# Patient Record
Sex: Female | Born: 1990 | Race: Black or African American | Hispanic: No | Marital: Single | Smoking: Never smoker
Health system: Southern US, Community
[De-identification: ages and names within clinical notes are randomized; demographics above are authoritative.]

## PROBLEM LIST (undated history)

## (undated) DIAGNOSIS — I10 Essential (primary) hypertension: Secondary | ICD-10-CM

## (undated) DIAGNOSIS — Z9114 Patient's other noncompliance with medication regimen: Secondary | ICD-10-CM

## (undated) DIAGNOSIS — Z91148 Patient's other noncompliance with medication regimen for other reason: Secondary | ICD-10-CM

## (undated) DIAGNOSIS — K8050 Calculus of bile duct without cholangitis or cholecystitis without obstruction: Secondary | ICD-10-CM

## (undated) HISTORY — PX: NO PAST SURGERIES: SHX2092

## (undated) HISTORY — PX: INGUINAL HERNIA REPAIR: SUR1180

---

## 2018-02-15 ENCOUNTER — Encounter (HOSPITAL_COMMUNITY): Payer: Self-pay | Admitting: Emergency Medicine

## 2018-02-15 ENCOUNTER — Emergency Department (HOSPITAL_COMMUNITY): Payer: Self-pay

## 2018-02-15 ENCOUNTER — Other Ambulatory Visit: Payer: Self-pay

## 2018-02-15 ENCOUNTER — Emergency Department (HOSPITAL_COMMUNITY)
Admission: EM | Admit: 2018-02-15 | Discharge: 2018-02-15 | Disposition: A | Discharge status: Home / Self Care | Payer: Self-pay | Attending: Emergency Medicine | Admitting: Emergency Medicine

## 2018-02-15 DIAGNOSIS — R109 Unspecified abdominal pain: Secondary | ICD-10-CM

## 2018-02-15 DIAGNOSIS — K8050 Calculus of bile duct without cholangitis or cholecystitis without obstruction: Secondary | ICD-10-CM

## 2018-02-15 DIAGNOSIS — K8020 Calculus of gallbladder without cholecystitis without obstruction: Secondary | ICD-10-CM | POA: Insufficient documentation

## 2018-02-15 DIAGNOSIS — I10 Essential (primary) hypertension: Secondary | ICD-10-CM | POA: Insufficient documentation

## 2018-02-15 HISTORY — DX: Essential (primary) hypertension: I10

## 2018-02-15 LAB — CBC
HCT: 39.9 % (ref 36.0–46.0)
Hemoglobin: 12.8 g/dL (ref 12.0–15.0)
MCH: 27.2 pg (ref 26.0–34.0)
MCHC: 32.1 g/dL (ref 30.0–36.0)
MCV: 84.9 fL (ref 78.0–100.0)
PLATELETS: 287 10*3/uL (ref 150–400)
RBC: 4.7 MIL/uL (ref 3.87–5.11)
RDW: 15 % (ref 11.5–15.5)
WBC: 8.6 10*3/uL (ref 4.0–10.5)

## 2018-02-15 LAB — URINALYSIS, ROUTINE W REFLEX MICROSCOPIC
Bilirubin Urine: NEGATIVE
GLUCOSE, UA: NEGATIVE mg/dL
Hgb urine dipstick: NEGATIVE
KETONES UR: NEGATIVE mg/dL
LEUKOCYTES UA: NEGATIVE
Nitrite: NEGATIVE
PROTEIN: NEGATIVE mg/dL
Specific Gravity, Urine: 1.015 (ref 1.005–1.030)
pH: 8 (ref 5.0–8.0)

## 2018-02-15 LAB — COMPREHENSIVE METABOLIC PANEL
ALT: 22 U/L (ref 14–54)
AST: 23 U/L (ref 15–41)
Albumin: 3.9 g/dL (ref 3.5–5.0)
Alkaline Phosphatase: 99 U/L (ref 38–126)
Anion gap: 11 (ref 5–15)
BILIRUBIN TOTAL: 0.4 mg/dL (ref 0.3–1.2)
BUN: 11 mg/dL (ref 6–20)
CHLORIDE: 100 mmol/L — AB (ref 101–111)
CO2: 25 mmol/L (ref 22–32)
CREATININE: 0.65 mg/dL (ref 0.44–1.00)
Calcium: 8.8 mg/dL — ABNORMAL LOW (ref 8.9–10.3)
GFR calc non Af Amer: >60 mL/min (ref >60)
Glucose, Bld: 123 mg/dL — ABNORMAL HIGH (ref 65–99)
Potassium: 3.2 mmol/L — ABNORMAL LOW (ref 3.5–5.1)
Sodium: 136 mmol/L (ref 135–145)
Total Protein: 7.9 g/dL (ref 6.5–8.1)

## 2018-02-15 LAB — LIPASE, BLOOD: LIPASE: 30 U/L (ref 11–51)

## 2018-02-15 LAB — PREGNANCY, URINE: PREG TEST UR: NEGATIVE

## 2018-02-15 MED ORDER — SODIUM CHLORIDE 0.9 % IV BOLUS (SEPSIS)
1000.0000 mL | Freq: Once | INTRAVENOUS | Status: AC
  Administered 2018-02-15: 1000 mL via INTRAVENOUS

## 2018-02-15 MED ORDER — LISINOPRIL 10 MG PO TABS: 10.0000 mg | ORAL_TABLET | Freq: Every day | ORAL | 0 refills | Status: DC

## 2018-02-15 MED ORDER — HYDROMORPHONE HCL 1 MG/ML IJ SOLN
0.5000 mg | Freq: Once | INTRAMUSCULAR | Status: AC
  Administered 2018-02-15: 0.5 mg via INTRAVENOUS
  Filled 2018-02-15: qty 1

## 2018-02-15 MED ORDER — ONDANSETRON HCL 4 MG PO TABS: 4.0000 mg | ORAL_TABLET | Freq: Three times a day (TID) | ORAL | 0 refills | Status: DC | PRN

## 2018-02-15 MED ORDER — HYDROCODONE-ACETAMINOPHEN 5-325 MG PO TABS: 1.0000 | ORAL_TABLET | Freq: Four times a day (QID) | ORAL | 0 refills | Status: DC | PRN

## 2018-02-15 NOTE — ED Notes (Signed)
US at bedside

## 2018-02-15 NOTE — ED Notes (Signed)
EDP at bedside  

## 2018-02-15 NOTE — ED Triage Notes (Addendum)
Pt reports sudden onset of constant RUQ pain and lightheadedness that began around 0500. Denies n/v/d. LMP in December.

## 2018-02-15 NOTE — ED Provider Notes (Signed)
Emergency Department Provider Note   I have reviewed the triage vital signs and the nursing notes.   HISTORY  Chief Complaint Abdominal Pain   HPI Leah Palmer is a 27 y.o. female with a past medical history of hypertension not on medication the presents to the emergency department today secondary to right upper quadrant abdominal pain.  Patient states that sometime last night she had onset of the pain and it progressed throughout the night however she was able to sleep.  States this morning around 5:00 it got worse and she started having some lightheadedness with it so she presented here for further evaluation.  She is had pain like this before sometimes it is very brief and fleeting but she has had to go to the hospital in the past and was told that she had gallstones.  She went to a surgeon but they would not do surgery as her blood pressure was too high.  Her boyfriend also states that she has had some abdominal pain in the past where she had a clot in her uterus that they had to pull out but that pain was more lower than what this is according to the patient.  She had no rashes, trauma, nausea, vomiting, constipation or diarrhea.  She has unprotected intercourse but states is impossible for her to be pregnant but will not elaborate further..  No other associated or modifying symptoms.    Past Medical History:  Diagnosis Date  . Hypertension     There are no active problems to display for this patient.   History reviewed. No pertinent surgical history.    Allergies Patient has no known allergies.  No family history on file.  Social History Social History   Tobacco Use  . Smoking status: Never Smoker  . Smokeless tobacco: Never Used  Substance Use Topics  . Alcohol use: Not Currently    Frequency: Never  . Drug use: Not on file    Review of Systems  All other systems negative except as documented in the HPI. All pertinent positives and negatives as reviewed in  the HPI. ____________________________________________   PHYSICAL EXAM:  VITAL SIGNS: ED Triage Vitals  Enc Vitals Group     BP 02/15/18 0658 (!) 170/108     Pulse Rate 02/15/18 0658 94     Resp 02/15/18 0658 20     Temp 02/15/18 0658 97.9 F (36.6 C)     Temp Source 02/15/18 0658 Oral     SpO2 02/15/18 0658 99 %     Weight 02/15/18 0657 235 lb (106.6 kg)     Height 02/15/18 0657 5\' 1"  (1.549 m)    Constitutional: Alert and oriented. Well appearing and in no acute distress. Eyes: Conjunctivae are normal. PERRL. EOMI. Head: Atraumatic. Nose: No congestion/rhinnorhea. Mouth/Throat: Mucous membranes are moist.  Oropharynx non-erythematous. Neck: No stridor.  No meningeal signs.   Cardiovascular: Normal rate, regular rhythm. Good peripheral circulation. Grossly normal heart sounds.   Respiratory: Normal respiratory effort.  No retractions. Lungs CTAB. Gastrointestinal: Soft and ttp RUQ. No distention.  Musculoskeletal: No lower extremity tenderness nor edema. No gross deformities of extremities. Neurologic:  Normal speech and language. No gross focal neurologic deficits are appreciated.  Skin:  Skin is warm, dry and intact. No rash noted.  ____________________________________________   LABS (all labs ordered are listed, but only abnormal results are displayed)  Labs Reviewed  COMPREHENSIVE METABOLIC PANEL - Abnormal; Notable for the following components:      Result  Value   Potassium 3.2 (*)    Chloride 100 (*)    Glucose, Bld 123 (*)    Calcium 8.8 (*)    All other components within normal limits  URINALYSIS, ROUTINE W REFLEX MICROSCOPIC - Abnormal; Notable for the following components:   APPearance CLOUDY (*)    All other components within normal limits  LIPASE, BLOOD  CBC  PREGNANCY, URINE   ____________________________________________  RADIOLOGY  [place] Abdomen Limited Ruq  Result Date: 02/15/2018 CLINICAL DATA:  Right upper quadrant pain since 5 a.m. EXAM:  ULTRASOUND ABDOMEN LIMITED RIGHT UPPER QUADRANT COMPARISON:  None. FINDINGS: Gallbladder: Multiple shadowing and layering gallstones. There is mild gallbladder wall thickening to 4-5 mm, but no focal tenderness or pericholecystic edema. Common bile duct: Diameter: 5 mm. Incomplete coverage. Where visualized, no filling defect. Liver: No focal lesion identified. Within normal limits in parenchymal echogenicity. Portal vein is patent on color Doppler imaging with normal direction of blood flow towards the liver. IMPRESSION: Multiple gallstones. There is gallbladder wall thickening but no focal tenderness or pericholecystic edema to increase chances of acute cholecystitis. Electronically Signed   By: Marnee SpringJonathon  Watts M.D.   On: 02/15/2018 09:12    ____________________________________________  INITIAL IMPRESSION / ASSESSMENT AND PLAN / ED COURSE  Signs symptoms seem consistent with likely biliary colic.  Less suspicion for the type of retained blood in the uterus at this point.  Pain resolved, ultrasound without evidence of cholecystitis and labs are unremarkable.  I suspect she has biliary colic as a cause of her symptoms will refer to general surgery as an outpatient.  Also restarted on blood pressure medication and will need to follow-up with primary doctor to get it continued.   Pertinent labs & imaging results that were available during my care of the patient were reviewed by me and considered in my medical decision making (see chart for details).  ____________________________________________  FINAL CLINICAL IMPRESSION(S) / ED DIAGNOSES  Final diagnoses:  Abdominal pain  Calculus of gallbladder without cholecystitis without obstruction  Biliary colic     MEDICATIONS GIVEN DURING THIS VISIT:  Medications  HYDROmorphone (DILAUDID) injection 0.5 mg (0.5 mg Intravenous Given 02/15/18 0725)  sodium chloride 0.9 % bolus 1,000 mL (0 mLs Intravenous Stopped 02/15/18 0823)     NEW OUTPATIENT  MEDICATIONS STARTED DURING THIS VISIT:  New Prescriptions   HYDROCODONE-ACETAMINOPHEN (NORCO/VICODIN) 5-325 MG TABLET    Take 1 tablet by mouth every 6 (six) hours as needed for severe pain.   LISINOPRIL (PRINIVIL,ZESTRIL) 10 MG TABLET    Take 1 tablet (10 mg total) by mouth daily.    Note:  This note was prepared with assistance of Dragon voice recognition software. Occasional wrong-word or sound-a-like substitutions may have occurred due to the inherent limitations of voice recognition software.   Marily MemosMesner, Chrishonda Hesch, MD 02/15/18 1012

## 2018-06-19 ENCOUNTER — Other Ambulatory Visit: Payer: Self-pay

## 2018-06-19 ENCOUNTER — Emergency Department (HOSPITAL_COMMUNITY): Payer: Self-pay

## 2018-06-19 ENCOUNTER — Emergency Department (HOSPITAL_COMMUNITY)
Admission: EM | Admit: 2018-06-19 | Discharge: 2018-06-19 | Disposition: A | Discharge status: Home / Self Care | Payer: Self-pay | Attending: Emergency Medicine | Admitting: Emergency Medicine

## 2018-06-19 ENCOUNTER — Encounter (HOSPITAL_COMMUNITY): Payer: Self-pay | Admitting: Emergency Medicine

## 2018-06-19 DIAGNOSIS — Z9119 Patient's noncompliance with other medical treatment and regimen: Secondary | ICD-10-CM | POA: Insufficient documentation

## 2018-06-19 DIAGNOSIS — E876 Hypokalemia: Secondary | ICD-10-CM | POA: Insufficient documentation

## 2018-06-19 DIAGNOSIS — R102 Pelvic and perineal pain: Secondary | ICD-10-CM | POA: Insufficient documentation

## 2018-06-19 DIAGNOSIS — R52 Pain, unspecified: Secondary | ICD-10-CM

## 2018-06-19 DIAGNOSIS — R1011 Right upper quadrant pain: Secondary | ICD-10-CM

## 2018-06-19 DIAGNOSIS — Z9114 Patient's other noncompliance with medication regimen: Secondary | ICD-10-CM

## 2018-06-19 DIAGNOSIS — K8020 Calculus of gallbladder without cholecystitis without obstruction: Secondary | ICD-10-CM | POA: Insufficient documentation

## 2018-06-19 DIAGNOSIS — I10 Essential (primary) hypertension: Secondary | ICD-10-CM | POA: Insufficient documentation

## 2018-06-19 DIAGNOSIS — Z79899 Other long term (current) drug therapy: Secondary | ICD-10-CM | POA: Insufficient documentation

## 2018-06-19 DIAGNOSIS — K8050 Calculus of bile duct without cholangitis or cholecystitis without obstruction: Secondary | ICD-10-CM | POA: Insufficient documentation

## 2018-06-19 HISTORY — DX: Calculus of bile duct without cholangitis or cholecystitis without obstruction: K80.50

## 2018-06-19 HISTORY — DX: Patient's other noncompliance with medication regimen for other reason: Z91.148

## 2018-06-19 HISTORY — DX: Patient's other noncompliance with medication regimen: Z91.14

## 2018-06-19 LAB — URINALYSIS, ROUTINE W REFLEX MICROSCOPIC
Bilirubin Urine: NEGATIVE
GLUCOSE, UA: NEGATIVE mg/dL
KETONES UR: 5 mg/dL — AB
LEUKOCYTES UA: NEGATIVE
NITRITE: NEGATIVE
PROTEIN: NEGATIVE mg/dL
Specific Gravity, Urine: 1.012 (ref 1.005–1.030)
pH: 8 (ref 5.0–8.0)

## 2018-06-19 LAB — CBC
HEMATOCRIT: 37.2 % (ref 36.0–46.0)
HEMOGLOBIN: 12.2 g/dL (ref 12.0–15.0)
MCH: 28 pg (ref 26.0–34.0)
MCHC: 32.8 g/dL (ref 30.0–36.0)
MCV: 85.3 fL (ref 78.0–100.0)
Platelets: 296 10*3/uL (ref 150–400)
RBC: 4.36 MIL/uL (ref 3.87–5.11)
RDW: 13.8 % (ref 11.5–15.5)
WBC: 10.4 10*3/uL (ref 4.0–10.5)

## 2018-06-19 LAB — COMPREHENSIVE METABOLIC PANEL
ALK PHOS: 86 U/L (ref 38–126)
ALT: 23 U/L (ref 0–44)
ANION GAP: 11 (ref 5–15)
AST: 24 U/L (ref 15–41)
Albumin: 4.2 g/dL (ref 3.5–5.0)
BILIRUBIN TOTAL: 0.3 mg/dL (ref 0.3–1.2)
BUN: 11 mg/dL (ref 6–20)
CO2: 27 mmol/L (ref 22–32)
Calcium: 9 mg/dL (ref 8.9–10.3)
Chloride: 101 mmol/L (ref 98–111)
Creatinine, Ser: 0.74 mg/dL (ref 0.44–1.00)
GLUCOSE: 138 mg/dL — AB (ref 70–99)
POTASSIUM: 2.6 mmol/L — AB (ref 3.5–5.1)
Sodium: 139 mmol/L (ref 135–145)
TOTAL PROTEIN: 7.9 g/dL (ref 6.5–8.1)

## 2018-06-19 LAB — LIPASE, BLOOD: Lipase: 39 U/L (ref 11–51)

## 2018-06-19 LAB — I-STAT BETA HCG BLOOD, ED (MC, WL, AP ONLY): I-stat hCG, quantitative: <5 m[IU]/mL (ref <5)

## 2018-06-19 LAB — PREGNANCY, URINE: Preg Test, Ur: NEGATIVE

## 2018-06-19 LAB — MAGNESIUM: Magnesium: 1.8 mg/dL (ref 1.7–2.4)

## 2018-06-19 MED ORDER — ONDANSETRON 4 MG PO TBDP: 4.0000 mg | ORAL_TABLET | Freq: Three times a day (TID) | ORAL | 0 refills | Status: DC | PRN

## 2018-06-19 MED ORDER — HYDROMORPHONE HCL 1 MG/ML IJ SOLN
1.0000 mg | INTRAMUSCULAR | Status: DC | PRN
  Administered 2018-06-19: 1 mg via INTRAVENOUS
  Filled 2018-06-19: qty 1

## 2018-06-19 MED ORDER — HYDROMORPHONE HCL 1 MG/ML IJ SOLN
1.0000 mg | Freq: Once | INTRAMUSCULAR | Status: AC
  Administered 2018-06-19: 1 mg via INTRAVENOUS
  Filled 2018-06-19: qty 1

## 2018-06-19 MED ORDER — ONDANSETRON HCL 4 MG/2ML IJ SOLN
4.0000 mg | Freq: Once | INTRAMUSCULAR | Status: AC
  Administered 2018-06-19: 4 mg via INTRAVENOUS
  Filled 2018-06-19: qty 2

## 2018-06-19 MED ORDER — HYDROCHLOROTHIAZIDE 25 MG PO TABS
25.0000 mg | ORAL_TABLET | Freq: Once | ORAL | Status: AC
  Administered 2018-06-19: 25 mg via ORAL
  Filled 2018-06-19: qty 1

## 2018-06-19 MED ORDER — MORPHINE SULFATE (PF) 4 MG/ML IV SOLN
8.0000 mg | INTRAVENOUS | Status: DC | PRN
  Administered 2018-06-19: 8 mg via INTRAVENOUS
  Filled 2018-06-19: qty 2

## 2018-06-19 MED ORDER — LISINOPRIL 10 MG PO TABS
20.0000 mg | ORAL_TABLET | Freq: Once | ORAL | Status: AC
  Administered 2018-06-19: 20 mg via ORAL
  Filled 2018-06-19: qty 2

## 2018-06-19 MED ORDER — HYDROCODONE-ACETAMINOPHEN 5-325 MG PO TABS: ORAL_TABLET | ORAL | 0 refills | Status: DC

## 2018-06-19 MED ORDER — POTASSIUM CHLORIDE 10 MEQ/100ML IV SOLN
10.0000 meq | INTRAVENOUS | Status: AC
  Administered 2018-06-19 (×4): 10 meq via INTRAVENOUS
  Filled 2018-06-19 (×4): qty 100

## 2018-06-19 NOTE — ED Notes (Addendum)
Pt reports she has been off her BP meds for 1 week and is suppose to pick it up today. Reports she takes Lisionpril. EDP notified

## 2018-06-19 NOTE — ED Notes (Signed)
Date and time results received: 06/19/18 0644 (use smartphrase ".now" to insert current time)  Test: potassium Critical Value: 2.6  Name of Provider Notified: Dr. Rhunette CroftNanavati @ [phone]  Orders Received? Or Actions Taken?: no/na

## 2018-06-19 NOTE — Progress Notes (Signed)
[facility]  Full Note to Follow. Likely biliary colic.  US pending. If Cholecystitis will admit and do Monday. If Biliary colic, follow up in the office.  Leah GreenhouseLindsay Elvera Almario, MD [facility] [address] [place], [place] [id number] [phone] (office)

## 2018-06-19 NOTE — Discharge Instructions (Signed)
Eat a bland diet, avoiding greasy, fatty, fried foods, as well as spicy and acidic foods or beverages.  Avoid eating within 2 to 3 hours before going to bed or laying down.  Also avoid teas, colas, coffee, chocolate, pepermint and spearment. Take the prescriptions as directed. Pick up your high blood pressure medication at the pharmacy and take it as previously prescribed. You have received a dose of your medication while in the Emergency Department today.  Call your regular medical doctor on Monday to schedule a follow up appointment in the next 3 days. Call the General Surgeon on Monday to schedule a follow up appointment this week.  Return to the Emergency Department immediately if worsening.

## 2018-06-19 NOTE — ED Triage Notes (Signed)
Pt c/o abd pain with n/v/d since 0100.

## 2018-06-19 NOTE — Consult Note (Signed)
[facility] Consult  Reason for Consult: Cholelithiasis  Referring Physician: Dr. Thurnell Garbe  Chief Complaint    Abdominal Pain      Leah Palmer is a 27 y.o. female.  HPI: Leah Palmer is a 27 yo with a history of known stones who presented to the ED with worsening RUQ pain after a fatty meal; She has known about her cholelithiasis and has been symptomatic in the past. She was going to undergo surgery per report in the past but her BP was not controlled and surgery said she had to get this under control.    The patient had some nausea with this pain.  She presented to the ED and underwent an [place] that demonstrated stones and no signs of any cholecystitis. She had been feeling better and not having symptoms until she ate this fatty meal.   Past Medical History:  Diagnosis Date  . Biliary colic   . Hypertension   . Noncompliance with medication regimen     History reviewed. No pertinent surgical history.  History reviewed. No pertinent family history.  Social History   Tobacco Use  . Smoking status: Never Smoker  . Smokeless tobacco: Never Used  Substance Use Topics  . Alcohol use: Not Currently    Frequency: Never  . Drug use: Never    Medications: I have reviewed the patient's current medications. Allergies as of 06/19/2018   No Known Allergies     Medication List    TAKE these medications   HYDROcodone-acetaminophen 5-325 MG tablet Commonly known as:  NORCO/VICODIN 1 or 2 tabs PO q6 hours prn pain   ondansetron 4 MG disintegrating tablet Commonly known as:  ZOFRAN ODT Take 1 tablet (4 mg total) by mouth every 8 (eight) hours as needed for nausea or vomiting.     ASK your doctor about these medications   ibuprofen 400 MG tablet Commonly known as:  ADVIL,MOTRIN Take 400 mg by mouth every 6 (six) hours as needed for headache, moderate pain or cramping.   lisinopril-hydrochlorothiazide 20-25 MG tablet Commonly known as:  PRINZIDE,ZESTORETIC Take 1  tablet by mouth daily.   ondansetron 4 MG tablet Commonly known as:  ZOFRAN Take 1 tablet (4 mg total) by mouth every 8 (eight) hours as needed for nausea or vomiting.        ROS:  A comprehensive review of systems was negative except for: Gastrointestinal: positive for abdominal pain and nausea  Blood pressure (!) 193/138, pulse 98, temperature 97.7 F (36.5 C), temperature source Oral, resp. rate 15, height 5' 1"  (1.549 m), weight 240 lb (108.9 kg), last menstrual period 06/16/2018, SpO2 98 %. Physical Exam  Constitutional: She is oriented to person, place, and time. She appears well-developed and well-nourished.  HENT:  Head: Normocephalic.  Eyes: Pupils are equal, round, and reactive to light.  Cardiovascular: Normal rate.  Pulmonary/Chest: Effort normal.  Abdominal: Normal appearance. She exhibits no distension. There is tenderness.  Minimal RUQ tenderness   Neurological: She is alert and oriented to person, place, and time.  Skin: Skin is warm and dry.  Psychiatric: She has a normal mood and affect. Her behavior is normal.  Vitals reviewed.   Results: Results for orders placed or performed during the hospital encounter of 06/19/18 (from the past 48 hour(s))  Lipase, blood     Status: None   Collection Time: 06/19/18  5:38 AM  Result Value Ref Range   Lipase 39 11 - 51 U/L    Comment: Performed at  [address] [place], [place] [address]  Comprehensive metabolic panel     Status: Abnormal   Collection Time: 06/19/18  5:38 AM  Result Value Ref Range   Sodium 139 135 - 145 mmol/L   Potassium 2.6 (LL) 3.5 - 5.1 mmol/L    Comment: CRITICAL RESULT CALLED TO, READ BACK BY AND VERIFIED WITH: TALBOTT T. @ [id number] ON [id number] BY HENDERSON L.    Chloride 101 98 - 111 mmol/L   CO2 27 22 - 32 mmol/L   Glucose, Bld 138 (H) 70 - 99 mg/dL   BUN 11 6 - 20 mg/dL   Creatinine, Ser 0.74 0.44 - 1.00 mg/dL   Calcium 9.0 8.9 - 10.3 mg/dL   Total Protein 7.9 6.5 - 8.1  g/dL   Albumin 4.2 3.5 - 5.0 g/dL   AST 24 15 - 41 U/L   ALT 23 0 - 44 U/L   Alkaline Phosphatase 86 38 - 126 U/L   Total Bilirubin 0.3 0.3 - 1.2 mg/dL   GFR calc non Af Amer >60 >60 mL/min   GFR calc Af Amer >60 >60 mL/min    Comment: (NOTE) The eGFR has been calculated using the CKD EPI equation. This calculation has not been validated in all clinical situations. eGFR's persistently <60 mL/min signify possible Chronic Kidney Disease.    Anion gap 11 5 - 15    Comment: Performed at [facility], [address] [place], [place] [address]  CBC     Status: None   Collection Time: 06/19/18  5:38 AM  Result Value Ref Range   WBC 10.4 4.0 - 10.5 K/uL   RBC 4.36 3.87 - 5.11 MIL/uL   Hemoglobin 12.2 12.0 - 15.0 g/dL   HCT 37.2 36.0 - 46.0 %   MCV 85.3 78.0 - 100.0 fL   MCH 28.0 26.0 - 34.0 pg   MCHC 32.8 30.0 - 36.0 g/dL   RDW 13.8 11.5 - 15.5 %   Platelets 296 150 - 400 K/uL    Comment: Performed at [facility], [address] [place], [place] [address]  Magnesium     Status: None   Collection Time: 06/19/18  5:38 AM  Result Value Ref Range   Magnesium 1.8 1.7 - 2.4 mg/dL    Comment: Performed at [facility], [address] [place], [place] [address]  Urinalysis, Routine w reflex microscopic     Status: Abnormal   Collection Time: 06/19/18  7:37 AM  Result Value Ref Range   Color, Urine YELLOW YELLOW   APPearance CLOUDY (A) CLEAR   Specific Gravity, Urine 1.012 1.005 - 1.030   pH 8.0 5.0 - 8.0   Glucose, UA NEGATIVE NEGATIVE mg/dL   Hgb urine dipstick MODERATE (A) NEGATIVE   Bilirubin Urine NEGATIVE NEGATIVE   Ketones, ur 5 (A) NEGATIVE mg/dL   Protein, ur NEGATIVE NEGATIVE mg/dL   Nitrite NEGATIVE NEGATIVE   Leukocytes, UA NEGATIVE NEGATIVE   RBC / HPF 0-5 0 - 5 RBC/hpf   WBC, UA 0-5 0 - 5 WBC/hpf   Bacteria, UA RARE (A) NONE SEEN   Squamous Epithelial / LPF 0-5 0 - 5   Mucus PRESENT    Budding Yeast PRESENT     Comment: Performed at [facility], [address] [place], [place] [address]  I-Stat beta hCG blood, ED     Status: None   Collection Time: 06/19/18  7:45 AM  Result Value Ref Range   I-stat hCG, quantitative <5.0 <  5 mIU/mL   Comment 3            Comment:   GEST. AGE      CONC.  (mIU/mL)   <=1 WEEK        5 - 50     2 WEEKS       50 - 500     3 WEEKS       100 - 10,000     4 WEEKS     1,000 - 30,000        FEMALE AND NON-PREGNANT FEMALE:     LESS THAN 5 mIU/mL   Pregnancy, urine     Status: None   Collection Time: 06/19/18  7:56 AM  Result Value Ref Range   Preg Test, Ur NEGATIVE NEGATIVE    Comment:        THE SENSITIVITY OF THIS METHODOLOGY IS >20 mIU/mL. Performed at [facility], [address], [place], [place] [address]    Personally reviewed [place] RUQ= stones, no thickening or fluid, CBD 3.5 mm US Pelvis Transvanginal Non-ob (tv Only)  Result Date: 06/19/2018 CLINICAL DATA:  Pelvic pain for several days EXAM: TRANSABDOMINAL AND TRANSVAGINAL ULTRASOUND OF PELVIS DOPPLER ULTRASOUND OF OVARIES TECHNIQUE: Both transabdominal and transvaginal ultrasound examinations of the pelvis were performed. Transabdominal technique was performed for global imaging of the pelvis including uterus, ovaries, adnexal regions, and pelvic cul-de-sac. It was necessary to proceed with endovaginal exam following the transabdominal exam to visualize the ovaries. Color and duplex Doppler ultrasound was utilized to evaluate blood flow to the ovaries. COMPARISON:  None. FINDINGS: Uterus Measurements: 8.2 x 2.5 x 4.7 cm. No fibroids or other mass visualized. Endometrium Thickness: 8 mm.  No focal abnormality visualized. Right ovary Measurements: 4.1 x 2.0 x 2.7 cm. Normal appearance/no adnexal mass. Left ovary Measurements: 3.9 x 2.4 x 2.4 cm. Normal appearance/no adnexal mass. Pulsed Doppler evaluation of both ovaries demonstrates normal low-resistance arterial and venous waveforms. Other findings Minimal free pelvic fluid is noted likely related to the patient's  current menstrual status. IMPRESSION: Unremarkable pelvic ultrasound. Minimal free fluid is noted likely physiologic in nature. No ovarian abnormality is noted. Electronically Signed   By: Inez Catalina M.D.   On: 06/19/2018 11:17   US Pelvis (transabdominal Only)  Result Date: 06/19/2018 CLINICAL DATA:  Pelvic pain for several days EXAM: TRANSABDOMINAL AND TRANSVAGINAL ULTRASOUND OF PELVIS DOPPLER ULTRASOUND OF OVARIES TECHNIQUE: Both transabdominal and transvaginal ultrasound examinations of the pelvis were performed. Transabdominal technique was performed for global imaging of the pelvis including uterus, ovaries, adnexal regions, and pelvic cul-de-sac. It was necessary to proceed with endovaginal exam following the transabdominal exam to visualize the ovaries. Color and duplex Doppler ultrasound was utilized to evaluate blood flow to the ovaries. COMPARISON:  None. FINDINGS: Uterus Measurements: 8.2 x 2.5 x 4.7 cm. No fibroids or other mass visualized. Endometrium Thickness: 8 mm.  No focal abnormality visualized. Right ovary Measurements: 4.1 x 2.0 x 2.7 cm. Normal appearance/no adnexal mass. Left ovary Measurements: 3.9 x 2.4 x 2.4 cm. Normal appearance/no adnexal mass. Pulsed Doppler evaluation of both ovaries demonstrates normal low-resistance arterial and venous waveforms. Other findings Minimal free pelvic fluid is noted likely related to the patient's current menstrual status. IMPRESSION: Unremarkable pelvic ultrasound. Minimal free fluid is noted likely physiologic in nature. No ovarian abnormality is noted. Electronically Signed   By: Inez Catalina M.D.   On: 06/19/2018 11:17   US Pelvic Doppler (torsion R/o Or Mass Arterial Flow)  Result Date: 06/19/2018 CLINICAL DATA:  Pelvic pain for several days EXAM: TRANSABDOMINAL AND TRANSVAGINAL ULTRASOUND OF PELVIS DOPPLER ULTRASOUND OF OVARIES TECHNIQUE: Both transabdominal and transvaginal ultrasound examinations of the pelvis were performed.  Transabdominal technique was performed for global imaging of the pelvis including uterus, ovaries, adnexal regions, and pelvic cul-de-sac. It was necessary to proceed with endovaginal exam following the transabdominal exam to visualize the ovaries. Color and duplex Doppler ultrasound was utilized to evaluate blood flow to the ovaries. COMPARISON:  None. FINDINGS: Uterus Measurements: 8.2 x 2.5 x 4.7 cm. No fibroids or other mass visualized. Endometrium Thickness: 8 mm.  No focal abnormality visualized. Right ovary Measurements: 4.1 x 2.0 x 2.7 cm. Normal appearance/no adnexal mass. Left ovary Measurements: 3.9 x 2.4 x 2.4 cm. Normal appearance/no adnexal mass. Pulsed Doppler evaluation of both ovaries demonstrates normal low-resistance arterial and venous waveforms. Other findings Minimal free pelvic fluid is noted likely related to the patient's current menstrual status. IMPRESSION: Unremarkable pelvic ultrasound. Minimal free fluid is noted likely physiologic in nature. No ovarian abnormality is noted. Electronically Signed   By: Inez Catalina M.D.   On: 06/19/2018 11:17   US Abdomen Limited Ruq  Result Date: 06/19/2018 CLINICAL DATA:  Right upper quadrant pain for 1 day EXAM: ULTRASOUND ABDOMEN LIMITED RIGHT UPPER QUADRANT COMPARISON:  02/15/2018 FINDINGS: Gallbladder: Cholelithiasis is again identified. No wall thickening or pericholecystic fluid is noted. Common bile duct: Diameter: 3.5 mm. Liver: No focal lesion identified. Within normal limits in parenchymal echogenicity. Portal vein is patent on color Doppler imaging with normal direction of blood flow towards the liver. IMPRESSION: Cholelithiasis without complicating factors. Electronically Signed   By: Inez Catalina M.D.   On: 06/19/2018 11:44     Assessment & Plan:  Rosene Pilling is a 27 y.o. female with cholelithiasis and no signs of cholecystitis.  She has known about this for a while but was feeling better.  -Po trial  -Follow up in the  clinic   All questions were answered to the satisfaction of the patient.   Virl Cagey 06/19/2018, 9:03 PM

## 2018-06-19 NOTE — ED Notes (Signed)
Pt given fluids at this time 

## 2018-06-19 NOTE — ED Provider Notes (Signed)
Pt received at sign out with [place] pending. Pt c/o RUQ pain, N/V. Pt with known gallstones. Pt non-compliant with her HTN meds for at least 1 week. States she has rx refill at pharmacy, just has not gone to pick it up. Potassium repleted IV before sign out, and labs are otherwise reassuring. [place] is reassuring. HTN meds given in ED. Pt has tol PO well without N/V. General Surgeon Dr. Henreitta Leber has evaluated pt in the ED:  No emergent surgical issue at this time, OK to d/c and f/u in office. Pt agreeable with this plan. Dx and testing d/w pt.  Questions answered.  Verb understanding, agreeable to d/c home with outpt f/u.  BP (!) 174/123   Pulse 98   Temp 97.7 F (36.5 C) (Oral)   Resp 15   Ht 5\' 1"  (1.549 m)   Wt 108.9 kg (240 lb)   LMP 06/16/2018   SpO2 96%   BMI 45.35 kg/m    Results for orders placed or performed during the hospital encounter of 06/19/18  Lipase, blood  Result Value Ref Range   Lipase 39 11 - 51 U/L  Comprehensive metabolic panel  Result Value Ref Range   Sodium 139 135 - 145 mmol/L   Potassium 2.6 (LL) 3.5 - 5.1 mmol/L   Chloride 101 98 - 111 mmol/L   CO2 27 22 - 32 mmol/L   Glucose, Bld 138 (H) 70 - 99 mg/dL   BUN 11 6 - 20 mg/dL   Creatinine, Ser 1.61 0.44 - 1.00 mg/dL   Calcium 9.0 8.9 - 09.6 mg/dL   Total Protein 7.9 6.5 - 8.1 g/dL   Albumin 4.2 3.5 - 5.0 g/dL   AST 24 15 - 41 U/L   ALT 23 0 - 44 U/L   Alkaline Phosphatase 86 38 - 126 U/L   Total Bilirubin 0.3 0.3 - 1.2 mg/dL   GFR calc non Af Amer >60 >60 mL/min   GFR calc Af Amer >60 >60 mL/min   Anion gap 11 5 - 15  CBC  Result Value Ref Range   WBC 10.4 4.0 - 10.5 K/uL   RBC 4.36 3.87 - 5.11 MIL/uL   Hemoglobin 12.2 12.0 - 15.0 g/dL   HCT 04.5 40.9 - 81.1 %   MCV 85.3 78.0 - 100.0 fL   MCH 28.0 26.0 - 34.0 pg   MCHC 32.8 30.0 - 36.0 g/dL   RDW 91.4 78.2 - 95.6 %   Platelets 296 150 - 400 K/uL  Urinalysis, Routine w reflex microscopic  Result Value Ref Range   Color, Urine YELLOW YELLOW    APPearance CLOUDY (A) CLEAR   Specific Gravity, Urine 1.012 1.005 - 1.030   pH 8.0 5.0 - 8.0   Glucose, UA NEGATIVE NEGATIVE mg/dL   Hgb urine dipstick MODERATE (A) NEGATIVE   Bilirubin Urine NEGATIVE NEGATIVE   Ketones, ur 5 (A) NEGATIVE mg/dL   Protein, ur NEGATIVE NEGATIVE mg/dL   Nitrite NEGATIVE NEGATIVE   Leukocytes, UA NEGATIVE NEGATIVE   RBC / HPF 0-5 0 - 5 RBC/hpf   WBC, UA 0-5 0 - 5 WBC/hpf   Bacteria, UA RARE (A) NONE SEEN   Squamous Epithelial / LPF 0-5 0 - 5   Mucus PRESENT    Budding Yeast PRESENT   Magnesium  Result Value Ref Range   Magnesium 1.8 1.7 - 2.4 mg/dL  Pregnancy, urine  Result Value Ref Range   Preg Test, Ur NEGATIVE NEGATIVE  I-Stat beta hCG blood, ED  Result Value Ref Range   I-stat hCG, quantitative <5.0 <5 mIU/mL   Comment 3            [place] Pelvic Doppler (torsion R/o Or Mass Arterial Flow) Result Date: 06/19/2018 CLINICAL DATA:  Pelvic pain for several days EXAM: TRANSABDOMINAL AND TRANSVAGINAL ULTRASOUND OF PELVIS DOPPLER ULTRASOUND OF OVARIES TECHNIQUE: Both transabdominal and transvaginal ultrasound examinations of the pelvis were performed. Transabdominal technique was performed for global imaging of the pelvis including uterus, ovaries, adnexal regions, and pelvic cul-de-sac. It was necessary to proceed with endovaginal exam following the transabdominal exam to visualize the ovaries. Color and duplex Doppler ultrasound was utilized to evaluate blood flow to the ovaries. COMPARISON:  None. FINDINGS: Uterus Measurements: 8.2 x 2.5 x 4.7 cm. No fibroids or other mass visualized. Endometrium Thickness: 8 mm.  No focal abnormality visualized. Right ovary Measurements: 4.1 x 2.0 x 2.7 cm. Normal appearance/no adnexal mass. Left ovary Measurements: 3.9 x 2.4 x 2.4 cm. Normal appearance/no adnexal mass. Pulsed Doppler evaluation of both ovaries demonstrates normal low-resistance arterial and venous waveforms. Other findings Minimal free pelvic fluid is noted  likely related to the patient's current menstrual status. IMPRESSION: Unremarkable pelvic ultrasound. Minimal free fluid is noted likely physiologic in nature. No ovarian abnormality is noted. Electronically Signed   By: Alcide CleverMark  Lukens M.D.   On: 06/19/2018 11:17   [place] Abdomen Limited Ruq Result Date: 06/19/2018 CLINICAL DATA:  Right upper quadrant pain for 1 day EXAM: ULTRASOUND ABDOMEN LIMITED RIGHT UPPER QUADRANT COMPARISON:  02/15/2018 FINDINGS: Gallbladder: Cholelithiasis is again identified. No wall thickening or pericholecystic fluid is noted. Common bile duct: Diameter: 3.5 mm. Liver: No focal lesion identified. Within normal limits in parenchymal echogenicity. Portal vein is patent on color Doppler imaging with normal direction of blood flow towards the liver. IMPRESSION: Cholelithiasis without complicating factors. Electronically Signed   By: Alcide CleverMark  Lukens M.D.   On: 06/19/2018 11:44      Samuel JesterMcManus, Leah Weisse, DO 06/19/18 1328

## 2018-06-19 NOTE — ED Notes (Signed)
Pt still calling out in pain, Dr Rhunette CroftNanavati informed and pain medication ordered and given

## 2018-06-19 NOTE — ED Provider Notes (Signed)
[facility] EMERGENCY DEPARTMENT Provider Note   CSN: [id number] Arrival date & time: 06/19/18  [id number]     History   Chief Complaint Chief Complaint  Patient presents with  . Abdominal Pain    HPI Leah Palmer is a 27 y.o. female.  HPI 27 year old female comes in with chief complaint of abdominal pain.  Patient does not have any significant medical history besides hypertension.  She reports that her current pain started at 1 AM, and it is constant and severe.  Pain is located in the epigastric region and right upper quadrant, and it radiates towards the scapular region.  Patient has associated nausea with emesis x2.  She denies any other abdominal surgery.  No history of kidney stones and patient denies any burning with urination, blood in the urine or frequent urination.  Patient thinks she is on her period right now.  Past Medical History:  Diagnosis Date  . Hypertension     There are no active problems to display for this patient.   History reviewed. No pertinent surgical history.   OB History    Gravida  0   Para  0   Term  0   Preterm  0   AB  0   Living  0     SAB  0   TAB  0   Ectopic  0   Multiple  0   Live Births  0            Home Medications    Prior to Admission medications   Medication Sig Start Date End Date Taking? Authorizing Provider  HYDROcodone-acetaminophen (NORCO/VICODIN) 5-325 MG tablet Take 1 tablet by mouth every 6 (six) hours as needed for severe pain. 02/15/18   Mesner, Barbara CowerJason, MD  ibuprofen (ADVIL,MOTRIN) 400 MG tablet Take 400 mg by mouth every 6 (six) hours as needed for headache, moderate pain or cramping.    [provider]  lisinopril (PRINIVIL,ZESTRIL) 10 MG tablet Take 1 tablet (10 mg total) by mouth daily. 02/15/18   Mesner, Barbara CowerJason, MD  ondansetron (ZOFRAN) 4 MG tablet Take 1 tablet (4 mg total) by mouth every 8 (eight) hours as needed for nausea or vomiting. 02/15/18   Mesner, Barbara CowerJason, MD    Family  History History reviewed. No pertinent family history.  Social History Social History   Tobacco Use  . Smoking status: Never Smoker  . Smokeless tobacco: Never Used  Substance Use Topics  . Alcohol use: Not Currently    Frequency: Never  . Drug use: Never     Allergies   Patient has no known allergies.   Review of Systems Review of Systems  Constitutional: Positive for activity change.  Respiratory: Negative for shortness of breath.   Cardiovascular: Negative for chest pain.  Gastrointestinal: Positive for abdominal pain, nausea and vomiting.  All other systems reviewed and are negative.    Physical Exam Updated Vital Signs BP (!) 189/137   Pulse 93   Temp 97.7 F (36.5 C) (Oral)   Resp 20   Ht 5\' 1"  (1.549 m)   Wt 108.9 kg (240 lb)   LMP 06/16/2018   SpO2 100%   BMI 45.35 kg/m   Physical Exam  Constitutional: She is oriented to person, place, and time. She appears well-developed.  HENT:  Head: Normocephalic and atraumatic.  Eyes: EOM are normal.  Neck: Normal range of motion. Neck supple.  Cardiovascular: Normal rate.  Pulmonary/Chest: Effort normal.  Abdominal: Bowel sounds are normal. There is  tenderness in the right upper quadrant and epigastric area. There is positive Murphy's sign.  Neurological: She is alert and oriented to person, place, and time.  Skin: Skin is warm and dry.  Nursing note and vitals reviewed.    ED Treatments / Results  Labs (all labs ordered are listed, but only abnormal results are displayed) Labs Reviewed  CBC  LIPASE, BLOOD  COMPREHENSIVE METABOLIC PANEL  URINALYSIS, ROUTINE W REFLEX MICROSCOPIC  I-STAT BETA HCG BLOOD, ED ([facility], [facility], [facility] ONLY)    EKG None  Radiology No results found.  Procedures Procedures (including critical care time)  Medications Ordered in ED Medications  morphine 4 MG/ML injection 8 mg (8 mg Intravenous Given 06/19/18 0600)  ondansetron (ZOFRAN) injection 4 mg (4 mg Intravenous Given  06/19/18 0600)  HYDROmorphone (DILAUDID) injection 1 mg (1 mg Intravenous Given 06/19/18 0640)     Initial Impression / Assessment and Plan / ED Course  I have reviewed the triage vital signs and the nursing notes.  Pertinent labs & imaging results that were available during my care of the patient were reviewed by me and considered in my medical decision making (see chart for details).     27 year old female comes in with chief complaint of abdominal pain. She has known history of cholelithiasis.  DDx includes: Pancreatitis Hepatobiliary pathology including cholecystitis Gastritis/PUD SBO Nephrolithiasis Ovarian torsion Ectopic pregnancy  Based on history and exam, it seems like patient is having acute cholecystitis. Ultrasound right upper quadrant has been ordered.  Given that patient's symptoms are not located in the lower quadrants, lower back and there is known history of cholelithiasis -we think symptomatic Coley lithiasis or cholecystitis is much likely than ectopic pregnancy or ovarian torsion/kidney stones.  Pain control for now. Final Clinical Impressions(s) / ED Diagnoses   Final diagnoses:  RUQ abdominal pain  Symptomatic cholelithiasis    ED Discharge Orders    None       Derwood Kaplan, MD 06/19/18 [phone]

## 2018-06-22 DIAGNOSIS — Z139 Encounter for screening, unspecified: Secondary | ICD-10-CM

## 2018-06-22 LAB — GLUCOSE, POCT (MANUAL RESULT ENTRY): POC Glucose: 99 mg/dl (ref 70–99)

## 2018-06-22 NOTE — Congregational Nurse Program (Signed)
Congregational Nurse Program Note  Date of Encounter: 06/22/2018  Past Medical History: Past Medical History:  Diagnosis Date  . Biliary colic   . Hypertension   . Noncompliance with medication regimen     Encounter Details: CNP Questionnaire - 06/22/18 1330      Questionnaire   Patient Status  Not Applicable    Race  Black or African American    Location Patient Served At  [employer]  Not Applicable    Uninsured  Uninsured (NEW 1x/quarter)    Food  No food insecurities    Housing/Utilities  Yes, have permanent housing    Transportation  No transportation needs    Interpersonal Safety  Yes, feel physically and emotionally safe where you currently live    Medication  No medication insecurities    Medical Provider  No    Referrals  Primary Care Provider/Clinic    ED Visit Averted  Not Applicable    Life-Saving Intervention Made  Not Applicable      New Client to [facility] , client had recently been to [facility] ER for abdominal pain and was found to have gallstones. She was given pain medication, medication for nausea, referred to surgeon Dr. Henreitta Leber whom client has a follow up appointment with on 06/29/18. Client here today for assistance with referral to a primary care provider. She currently has no income, has no insurance and moved to [place] 5 months ago and lives with her boyfriend in [place]. She states her boyfriend works and pays the bills and that is how she is currently supported. She states she feels safe where she is living, but just has not learned there area yet.  Client states her abdominal pain is much better and comes and goes. She does report that she has nausea and decreased appetite. Client was last seen at a clinic in [place] where she was living and she states they prescribed medication for her hypertension. She reports she was "off my medicine for about a week or so, but started it back a couple days ago". RN discussed  importance of keeping her blood pressure under control and the risks of uncontrolled blood pressure, but her at risk for stroke, heart attack and death. Client states understanding. Client is currently taking Lisinopril/HCTZ and states the dose was recently increased to 20-25 mg daily. RN discussed that HCTZ has a diuretic affect and discussed the importance of maintaining good hydration with water.  Client denies chest pains or shortness of breath. She denies today any headache or vision changes. No difficulty with speech and gait is normal and steady denies any problems with balance or weakness of extremities.  Discussed symptoms of stroke with client and to call 911 immediately if experiences any of the symptoms. Client does report that at times she does and a headache that is mostly frontal in nature and she relates it to when her blood pressure is elevated. Discussed with client the affects of uncontrolled blood pressure on her circulation, heart, eyes, kidneys etc. Client reports understanding.  Client alert and oriented. Very quiet and looks down mostly. Does not appear anxious or nervous and answers questions appropriately. Lungs clear bilaterally , O2 saturation 99 % on room air, blood pressure auscultated by Hedda Slade RN at 150/98, pulse 90.Respirations 12, capillary blood glucose fasting 99. Temp 98.9 orally. Abdomin soft. Client states she has not eaten today. Discussed a bland , "gallbladder" type diet, client states she has a  copy the emergency room provided. Discussed to eat bland foods such as plain potatoes, rice, toast, no fried or greasy foods, not spicy foods and to not eat late at night and lie down. Client states understanding.   Discussed with client importance of keeping her appointments with Dr Henreitta LeberBridges and to maintain her appointments with her primary care provider in order to maintain health and to keep her hypertension controlled. Discussed with client the importance of  adopting some daily exercise into her routine and options for doing so such as the [employer] which is close to her and have scholarships.  Discussed with client options for referral into primary care and that they would manage her hypertension and overall general medical care and help her navigate into specialist as needed.  Client [facility] due to closer location from where she resides. Referral made and appointment secured for 06/24/18 at 10:45 am. Client instructed to bring a letter of support from her boyfriend stating she resides with him and location and that he supports her financially.   Will follow as needed.  Francee NodalPatricia R Hollie Wojahn RN [facility]

## 2018-06-24 ENCOUNTER — Ambulatory Visit: Payer: Self-pay | Admitting: Physician Assistant

## 2018-06-27 ENCOUNTER — Inpatient Hospital Stay (HOSPITAL_COMMUNITY)
Admission: EM | Admit: 2018-06-27 | Discharge: 2018-06-30 | DRG: 418 | Disposition: A | Discharge status: Home / Self Care | Payer: Self-pay | Attending: Family Medicine | Admitting: Family Medicine

## 2018-06-27 ENCOUNTER — Encounter (HOSPITAL_COMMUNITY): Payer: Self-pay | Admitting: Emergency Medicine

## 2018-06-27 ENCOUNTER — Other Ambulatory Visit: Payer: Self-pay

## 2018-06-27 ENCOUNTER — Emergency Department (HOSPITAL_COMMUNITY): Payer: Self-pay

## 2018-06-27 DIAGNOSIS — J9811 Atelectasis: Secondary | ICD-10-CM | POA: Diagnosis not present

## 2018-06-27 DIAGNOSIS — Z6841 Body Mass Index (BMI) 40.0 and over, adult: Secondary | ICD-10-CM

## 2018-06-27 DIAGNOSIS — Z9114 Patient's other noncompliance with medication regimen: Secondary | ICD-10-CM

## 2018-06-27 DIAGNOSIS — E669 Obesity, unspecified: Secondary | ICD-10-CM | POA: Diagnosis present

## 2018-06-27 DIAGNOSIS — E876 Hypokalemia: Secondary | ICD-10-CM | POA: Diagnosis present

## 2018-06-27 DIAGNOSIS — K529 Noninfective gastroenteritis and colitis, unspecified: Secondary | ICD-10-CM | POA: Diagnosis present

## 2018-06-27 DIAGNOSIS — Z91148 Patient's other noncompliance with medication regimen for other reason: Secondary | ICD-10-CM

## 2018-06-27 DIAGNOSIS — R160 Hepatomegaly, not elsewhere classified: Secondary | ICD-10-CM | POA: Diagnosis present

## 2018-06-27 DIAGNOSIS — I10 Essential (primary) hypertension: Secondary | ICD-10-CM | POA: Diagnosis present

## 2018-06-27 DIAGNOSIS — K810 Acute cholecystitis: Secondary | ICD-10-CM | POA: Diagnosis present

## 2018-06-27 DIAGNOSIS — K8000 Calculus of gallbladder with acute cholecystitis without obstruction: Principal | ICD-10-CM | POA: Diagnosis present

## 2018-06-27 LAB — COMPREHENSIVE METABOLIC PANEL
ALT: 17 U/L (ref 0–44)
ANION GAP: 7 (ref 5–15)
AST: 16 U/L (ref 15–41)
Albumin: 3.6 g/dL (ref 3.5–5.0)
Alkaline Phosphatase: 75 U/L (ref 38–126)
BUN: 8 mg/dL (ref 6–20)
CALCIUM: 9.2 mg/dL (ref 8.9–10.3)
CO2: 34 mmol/L — ABNORMAL HIGH (ref 22–32)
Chloride: 99 mmol/L (ref 98–111)
Creatinine, Ser: 0.73 mg/dL (ref 0.44–1.00)
GFR calc non Af Amer: >60 mL/min (ref >60)
GLUCOSE: 109 mg/dL — AB (ref 70–99)
POTASSIUM: 2.9 mmol/L — AB (ref 3.5–5.1)
SODIUM: 140 mmol/L (ref 135–145)
TOTAL PROTEIN: 7.4 g/dL (ref 6.5–8.1)
Total Bilirubin: 0.4 mg/dL (ref 0.3–1.2)

## 2018-06-27 LAB — CBC
HEMATOCRIT: 35.2 % — AB (ref 36.0–46.0)
HEMOGLOBIN: 11.2 g/dL — AB (ref 12.0–15.0)
MCH: 27.4 pg (ref 26.0–34.0)
MCHC: 31.8 g/dL (ref 30.0–36.0)
MCV: 86.1 fL (ref 78.0–100.0)
Platelets: 337 10*3/uL (ref 150–400)
RBC: 4.09 MIL/uL (ref 3.87–5.11)
RDW: 13.4 % (ref 11.5–15.5)
WBC: 6.8 10*3/uL (ref 4.0–10.5)

## 2018-06-27 LAB — URINALYSIS, ROUTINE W REFLEX MICROSCOPIC
BACTERIA UA: NONE SEEN
Bilirubin Urine: NEGATIVE
Glucose, UA: NEGATIVE mg/dL
Ketones, ur: NEGATIVE mg/dL
Leukocytes, UA: NEGATIVE
NITRITE: NEGATIVE
PROTEIN: NEGATIVE mg/dL
SPECIFIC GRAVITY, URINE: 1.009 (ref 1.005–1.030)
pH: 8 (ref 5.0–8.0)

## 2018-06-27 LAB — PREGNANCY, URINE: PREG TEST UR: NEGATIVE

## 2018-06-27 LAB — SURGICAL PCR SCREEN
MRSA, PCR: NEGATIVE
Staphylococcus aureus: NEGATIVE

## 2018-06-27 LAB — MAGNESIUM: Magnesium: 1.9 mg/dL (ref 1.7–2.4)

## 2018-06-27 LAB — LIPASE, BLOOD: LIPASE: 36 U/L (ref 11–51)

## 2018-06-27 MED ORDER — POTASSIUM CHLORIDE IN NACL 20-0.9 MEQ/L-% IV SOLN
INTRAVENOUS | Status: DC
  Administered 2018-06-27 – 2018-06-28 (×2): via INTRAVENOUS

## 2018-06-27 MED ORDER — LISINOPRIL-HYDROCHLOROTHIAZIDE 20-25 MG PO TABS: 1.0000 | ORAL_TABLET | Freq: Every day | ORAL | Status: DC

## 2018-06-27 MED ORDER — ONDANSETRON HCL 4 MG PO TABS: 4.0000 mg | ORAL_TABLET | Freq: Four times a day (QID) | ORAL | Status: DC | PRN

## 2018-06-27 MED ORDER — MORPHINE SULFATE (PF) 4 MG/ML IV SOLN
4.0000 mg | Freq: Once | INTRAVENOUS | Status: AC
  Administered 2018-06-27: 4 mg via INTRAVENOUS
  Filled 2018-06-27: qty 1

## 2018-06-27 MED ORDER — POTASSIUM CHLORIDE 10 MEQ/100ML IV SOLN
10.0000 meq | INTRAVENOUS | Status: AC
  Administered 2018-06-27 (×2): 10 meq via INTRAVENOUS
  Filled 2018-06-27 (×2): qty 100

## 2018-06-27 MED ORDER — CHLORHEXIDINE GLUCONATE CLOTH 2 % EX PADS
6.0000 | MEDICATED_PAD | Freq: Once | CUTANEOUS | Status: AC
  Administered 2018-06-27: 6 via TOPICAL

## 2018-06-27 MED ORDER — ONDANSETRON HCL 4 MG/2ML IJ SOLN
4.0000 mg | Freq: Once | INTRAMUSCULAR | Status: AC
  Administered 2018-06-27: 4 mg via INTRAVENOUS
  Filled 2018-06-27: qty 2

## 2018-06-27 MED ORDER — ONDANSETRON HCL 4 MG/2ML IJ SOLN: 4.0000 mg | Freq: Four times a day (QID) | INTRAMUSCULAR | Status: DC | PRN

## 2018-06-27 MED ORDER — OXYCODONE HCL 5 MG PO TABS
5.0000 mg | ORAL_TABLET | ORAL | Status: DC | PRN
  Administered 2018-06-27 – 2018-06-30 (×4): 5 mg via ORAL
  Filled 2018-06-27 (×4): qty 1

## 2018-06-27 MED ORDER — SODIUM CHLORIDE 0.9 % IV SOLN
2.0000 g | Freq: Once | INTRAVENOUS | Status: AC
  Administered 2018-06-27: 2 g via INTRAVENOUS
  Filled 2018-06-27: qty 20

## 2018-06-27 MED ORDER — IOPAMIDOL (ISOVUE-300) INJECTION 61%
100.0000 mL | Freq: Once | INTRAVENOUS | Status: AC | PRN
  Administered 2018-06-27: 100 mL via INTRAVENOUS

## 2018-06-27 MED ORDER — SODIUM CHLORIDE 0.9 % IV BOLUS
1000.0000 mL | Freq: Once | INTRAVENOUS | Status: AC
  Administered 2018-06-27: 1000 mL via INTRAVENOUS

## 2018-06-27 MED ORDER — KETOROLAC TROMETHAMINE 30 MG/ML IJ SOLN
30.0000 mg | Freq: Four times a day (QID) | INTRAMUSCULAR | Status: DC | PRN
  Administered 2018-06-27 – 2018-06-28 (×2): 30 mg via INTRAVENOUS
  Filled 2018-06-27 (×3): qty 1

## 2018-06-27 MED ORDER — HYDROCHLOROTHIAZIDE 25 MG PO TABS: 25.0000 mg | ORAL_TABLET | Freq: Every day | ORAL | Status: DC

## 2018-06-27 MED ORDER — CHLORHEXIDINE GLUCONATE CLOTH 2 % EX PADS
6.0000 | MEDICATED_PAD | Freq: Once | CUTANEOUS | Status: AC
  Administered 2018-06-28: 6 via TOPICAL

## 2018-06-27 MED ORDER — LISINOPRIL 10 MG PO TABS
20.0000 mg | ORAL_TABLET | Freq: Every day | ORAL | Status: DC
Start: 2018-06-28 — End: 2018-06-30
  Administered 2018-06-28 – 2018-06-29 (×2): 20 mg via ORAL
  Filled 2018-06-27 (×2): qty 2

## 2018-06-27 NOTE — ED Provider Notes (Signed)
[facility] EMERGENCY DEPARTMENT Provider Note   CSN: [id number] Arrival date & time: 06/27/18  1156     History   Chief Complaint Chief Complaint  Patient presents with  . Abdominal Pain    HPI Leah Palmer is a 27 y.o. female.  HPI Patient has history of gallstones.  She has an appointment to follow-up with  surgeon on 2 days.  Has been seen several times in the emergency department in the past few weeks for biliary colic.  Presents with right upper quadrant abdominal pain that radiates to her back that started around 9 AM.  Associated with nausea but no vomiting.  Did have some loose stools.  No blood in stool.  No fever chills.  No difficulty breathing or coughing. Past Medical History:  Diagnosis Date  . Biliary colic   . Hypertension   . Noncompliance with medication regimen     Patient Active Problem List   Diagnosis Date Noted  . Acute cholecystitis 06/27/2018  . Biliary colic     History reviewed. No pertinent surgical history.   OB History    Gravida  0   Para  0   Term  0   Preterm  0   AB  0   Living  0     SAB  0   TAB  0   Ectopic  0   Multiple  0   Live Births  0            Home Medications    Prior to Admission medications   Medication Sig Start Date End Date Taking? Authorizing Provider  HYDROcodone-acetaminophen (NORCO/VICODIN) 5-325 MG tablet 1 or 2 tabs PO q6 hours prn pain Patient taking differently: Take 1-2 tablets by mouth every 6 (six) hours as needed for moderate pain or severe pain.  06/19/18  Yes Samuel Jester, DO  lisinopril-hydrochlorothiazide (PRINZIDE,ZESTORETIC) 20-25 MG tablet Take 1 tablet by mouth daily. 04/06/18  Yes [provider]  ondansetron (ZOFRAN ODT) 4 MG disintegrating tablet Take 1 tablet (4 mg total) by mouth every 8 (eight) hours as needed for nausea or vomiting. 06/19/18  Yes Samuel Jester, DO    Family History No family history on file.  Social History Social History    Tobacco Use  . Smoking status: Never Smoker  . Smokeless tobacco: Never Used  Substance Use Topics  . Alcohol use: Not Currently    Frequency: Never  . Drug use: Never     Allergies   Patient has no known allergies.   Review of Systems Review of Systems  Constitutional: Negative for chills and fever.  HENT: Negative for sore throat and trouble swallowing.   Eyes: Negative for visual disturbance.  Respiratory: Negative for cough and shortness of breath.   Cardiovascular: Negative for chest pain.  Gastrointestinal: Positive for abdominal pain, diarrhea and nausea. Negative for blood in stool, constipation and vomiting.  Genitourinary: Negative for dysuria, flank pain, frequency and hematuria.  Musculoskeletal: Positive for back pain. Negative for arthralgias, neck pain and neck stiffness.  Skin: Negative for rash and wound.  Neurological: Negative for dizziness, weakness, light-headedness, numbness and headaches.  All other systems reviewed and are negative.    Physical Exam Updated Vital Signs BP (!) 169/121   Pulse (!) 101   Temp 97.9 F (36.6 C) (Oral)   Resp 18   Ht 5\' 1"  (1.549 m)   Wt 110.7 kg (244 lb)   LMP 06/16/2018 Comment: neg u preg 06/27/18  SpO2  100%   BMI 46.10 kg/m   Physical Exam  Constitutional: She is oriented to person, place, and time. She appears well-developed and well-nourished. No distress.  HENT:  Head: Normocephalic and atraumatic.  Mouth/Throat: Oropharynx is clear and moist. No oropharyngeal exudate.  Eyes: Pupils are equal, round, and reactive to light. EOM are normal.  Neck: Normal range of motion. Neck supple.  Cardiovascular: Normal rate and regular rhythm.  Pulmonary/Chest: Effort normal and breath sounds normal.  Abdominal: Soft. Bowel sounds are normal. There is tenderness. There is no rebound and no guarding.  Right upper quadrant tenderness to palpation.  No rebound or guarding.  Musculoskeletal: Normal range of motion. She  exhibits no edema or tenderness.  No CVA tenderness.  No midline thoracic or lumbar tenderness.  No lower extremity swelling, asymmetry or tenderness.  Neurological: She is alert and oriented to person, place, and time.  Moves all extremities without focal deficit.  Sensation fully intact.  Skin: Skin is warm and dry. Capillary refill takes less than 2 seconds. No rash noted. She is not diaphoretic. No erythema.  Psychiatric: She has a normal mood and affect. Her behavior is normal.  Nursing note and vitals reviewed.    ED Treatments / Results  Labs (all labs ordered are listed, but only abnormal results are displayed) Labs Reviewed  COMPREHENSIVE METABOLIC PANEL - Abnormal; Notable for the following components:      Result Value   Potassium 2.9 (*)    CO2 34 (*)    Glucose, Bld 109 (*)    All other components within normal limits  CBC - Abnormal; Notable for the following components:   Hemoglobin 11.2 (*)    HCT 35.2 (*)    All other components within normal limits  URINALYSIS, ROUTINE W REFLEX MICROSCOPIC - Abnormal; Notable for the following components:   Color, Urine STRAW (*)    Hgb urine dipstick MODERATE (*)    All other components within normal limits  LIPASE, BLOOD  PREGNANCY, URINE    EKG None  Radiology Ct Abdomen Pelvis W Contrast  Result Date: 06/27/2018 CLINICAL DATA:  Upper abdominal pain beginning this morning, nausea and diarrhea. History of biliary colic and hypertension. EXAM: CT ABDOMEN AND PELVIS WITH CONTRAST TECHNIQUE: Multidetector CT imaging of the abdomen and pelvis was performed using the standard protocol following bolus administration of intravenous contrast. CONTRAST:  100mL ISOVUE-300 IOPAMIDOL (ISOVUE-300) INJECTION 61% COMPARISON:  Pelvic ultrasound June 19, 2018 and abdominal ultrasound June 19, 2018. FINDINGS: LOWER CHEST: Lung bases are clear. Included heart size is normal. No pericardial effusion. HEPATOBILIARY: Mild gallbladder distention and  pericholecystic fluid. No CT findings of cholelithiasis though ultrasound is more sensitive. Minimal intrahepatic biliary dilatation. Focal fatty infiltration about the falciform ligament. PANCREAS: Normal. SPLEEN: 14 mm homogeneously hypodense cyst or lymphangioma in the spleen. ADRENALS/URINARY TRACT: Kidneys are orthotopic, demonstrating symmetric enhancement. No nephrolithiasis, hydronephrosis or solid renal masses. The unopacified ureters are normal in course and caliber. Urinary bladder is adequately distended and unremarkable. Normal adrenal glands. STOMACH/BOWEL: The stomach, small and large bowel are normal in course and caliber without inflammatory changes. Small and large bowel air-fluid levels. Fluid distended 10 mm appendix without wall thickening or periappendiceal inflammation. VASCULAR/LYMPHATIC: Aortoiliac vessels are normal in course and caliber. No lymphadenopathy by CT size criteria. REPRODUCTIVE: 3 cm homogeneously hypodense benign-appearing RIGHT adnexal cyst. Air density in the vagina consistent with a tampon. OTHER: No intraperitoneal free fluid or free air. MUSCULOSKELETAL: Nonacute. IMPRESSION: 1. Pericholecystic fluid and mild  intrahepatic biliary dilatation concerning for acute cholecystitis. 2. Small and large bowel air-fluid levels seen with enteritis. 3. Enlarged 10 mm appendix without corroborative findings of acute appendicitis. This may be related to patient's enteritis. Electronically Signed   By: Awilda Metro M.D.   On: 06/27/2018 18:11    Procedures Procedures (including critical care time)  Medications Ordered in ED Medications  potassium chloride 10 mEq in 100 mL IVPB (10 mEq Intravenous New Bag/Given 06/27/18 1807)  cefTRIAXone (ROCEPHIN) 2 g in sodium chloride 0.9 % 100 mL IVPB (2 g Intravenous New Bag/Given 06/27/18 1838)  morphine 4 MG/ML injection 4 mg (4 mg Intravenous Given 06/27/18 1511)  ondansetron (ZOFRAN) injection 4 mg (4 mg Intravenous Given 06/27/18 1511)   sodium chloride 0.9 % bolus 1,000 mL (0 mLs Intravenous Stopped 06/27/18 1627)  morphine 4 MG/ML injection 4 mg (4 mg Intravenous Given by Other 06/27/18 1759)  iopamidol (ISOVUE-300) 61 % injection 100 mL (100 mLs Intravenous Contrast Given 06/27/18 1748)     Initial Impression / Assessment and Plan / ED Course  I have reviewed the triage vital signs and the nursing notes.  Pertinent labs & imaging results that were available during my care of the patient were reviewed by me and considered in my medical decision making (see chart for details).    Patient with persistent pain despite medication.  Lipitor work-up is normal.  Obtain CT abdomen pelvis with gallbladder wall thickening and pericholecystic fluid.  Concerning for acute cholecystitis.  Discussed with Dr. Lovell Sheehan.  Asked to have hospitalist admit and patient be kept n.p.o. after midnight.  Advises giving Rocephin and will see patient in the morning.  Dr. Onalee Hua will admit.   Final Clinical Impressions(s) / ED Diagnoses   Final diagnoses:  Acute cholecystitis    ED Discharge Orders    None       Loren Racer, MD 06/27/18 [phone]

## 2018-06-27 NOTE — ED Notes (Signed)
Pt aware a urine specimen is needed. Will notify staff when one can be obtained. 

## 2018-06-27 NOTE — ED Triage Notes (Addendum)
Patient c/o right upper abd pain that started this morning. Per patient nausea and diarrhea. Denies any vomiting or urinary symptoms. Unsure of any fevers. Patient reports being seen here in ED last week fore "gallbladder issues."

## 2018-06-27 NOTE — H&P (Signed)
History and Physical    Leah Palmer ZOX:[id number] DOB: 03-20-1991 DOA: 06/27/2018  PCP: Patient, No Pcp Per  Patient coming from: Home  Chief Complaint: Abdominal pain  HPI: Leah Palmer is a 27 y.o. female with medical history significant of obesity, hypertension, biliary colic comes in with persistent right upper quadrant abdominal pain that is not gone away in several days.  She denies any fevers.  She has occasional vomiting.  No diarrhea.  She is post follow-up with Dr. Donell Beers in 2 days for surgical evaluation for gallbladder removal.  However she is much more symptomatic now and cannot wait for outpatient evaluation.  Dr. Lovell Sheehan has been called who will take her to the OR in the morning.  Review of Systems: As per HPI otherwise 10 point review of systems negative.   Past Medical History:  Diagnosis Date  . Biliary colic   . Hypertension   . Noncompliance with medication regimen     History reviewed. No pertinent surgical history.   reports that she has never smoked. She has never used smokeless tobacco. She reports that she drank alcohol. She reports that she does not use drugs.  No Known Allergies  No family history on file.  No premature coronary artery disease  Prior to Admission medications   Medication Sig Start Date End Date Taking? Authorizing Provider  HYDROcodone-acetaminophen (NORCO/VICODIN) 5-325 MG tablet 1 or 2 tabs PO q6 hours prn pain Patient taking differently: Take 1-2 tablets by mouth every 6 (six) hours as needed for moderate pain or severe pain.  06/19/18  Yes Samuel Jester, DO  lisinopril-hydrochlorothiazide (PRINZIDE,ZESTORETIC) 20-25 MG tablet Take 1 tablet by mouth daily. 04/06/18  Yes [provider]  ondansetron (ZOFRAN ODT) 4 MG disintegrating tablet Take 1 tablet (4 mg total) by mouth every 8 (eight) hours as needed for nausea or vomiting. 06/19/18  Yes Samuel Jester, DO    Physical Exam: Vitals:   06/27/18 1812 06/27/18 1815  06/27/18 1818 06/27/18 1830  BP:    (!) 169/121  Pulse: 91 93 96 (!) 101  Resp:  (!) 8 15 18   Temp:      TempSrc:      SpO2: 97% (!) 84% 96% 100%  Weight:      Height:          Constitutional: NAD, calm, comfortable Vitals:   06/27/18 1812 06/27/18 1815 06/27/18 1818 06/27/18 1830  BP:    (!) 169/121  Pulse: 91 93 96 (!) 101  Resp:  (!) 8 15 18   Temp:      TempSrc:      SpO2: 97% (!) 84% 96% 100%  Weight:      Height:       Eyes: PERRL, lids and conjunctivae normal ENMT: Mucous membranes are moist. Posterior pharynx clear of any exudate or lesions.Normal dentition.  Neck: normal, supple, no masses, no thyromegaly Respiratory: clear to auscultation bilaterally, no wheezing, no crackles. Normal respiratory effort. No accessory muscle use.  Cardiovascular: Regular rate and rhythm, no murmurs / rubs / gallops. No extremity edema. 2+ pedal pulses. No carotid bruits.  Abdomen: Mild right upper quadrant tenderness no rebound no guarding, no masses palpated. No hepatosplenomegaly. Bowel sounds positive.  Musculoskeletal: no clubbing / cyanosis. No joint deformity upper and lower extremities. Good ROM, no contractures. Normal muscle tone.  Skin: no rashes, lesions, ulcers. No induration Neurologic: CN 2-12 grossly intact. Sensation intact, DTR normal. Strength 5/5 in all 4.  Psychiatric: Normal judgment and insight. Alert and  oriented x 3. Normal mood.    Labs on Admission: I have personally reviewed following labs and imaging studies  CBC: Recent Labs  Lab 06/27/18 1238  WBC 6.8  HGB 11.2*  HCT 35.2*  MCV 86.1  PLT 337   Basic Metabolic Panel: Recent Labs  Lab 06/27/18 1238  NA 140  K 2.9*  CL 99  CO2 34*  GLUCOSE 109*  BUN 8  CREATININE 0.73  CALCIUM 9.2   GFR: Estimated Creatinine Clearance: 123.9 mL/min (by C-G formula based on SCr of 0.73 mg/dL). Liver Function Tests: Recent Labs  Lab 06/27/18 1238  AST 16  ALT 17  ALKPHOS 75  BILITOT 0.4  PROT 7.4    ALBUMIN 3.6   Recent Labs  Lab 06/27/18 1238  LIPASE 36   No results for input(s): AMMONIA in the last 168 hours. Coagulation Profile: No results for input(s): INR, PROTIME in the last 168 hours. Cardiac Enzymes: No results for input(s): CKTOTAL, CKMB, CKMBINDEX, TROPONINI in the last 168 hours. BNP (last 3 results) No results for input(s): PROBNP in the last 8760 hours. HbA1C: No results for input(s): HGBA1C in the last 72 hours. CBG: No results for input(s): GLUCAP in the last 168 hours. Lipid Profile: No results for input(s): CHOL, HDL, LDLCALC, TRIG, CHOLHDL, LDLDIRECT in the last 72 hours. Thyroid Function Tests: No results for input(s): TSH, T4TOTAL, FREET4, T3FREE, THYROIDAB in the last 72 hours. Anemia Panel: No results for input(s): VITAMINB12, FOLATE, FERRITIN, TIBC, IRON, RETICCTPCT in the last 72 hours. Urine analysis:    Component Value Date/Time   COLORURINE STRAW (A) 06/27/2018 1630   APPEARANCEUR CLEAR 06/27/2018 1630   LABSPEC 1.009 06/27/2018 1630   PHURINE 8.0 06/27/2018 1630   GLUCOSEU NEGATIVE 06/27/2018 1630   HGBUR MODERATE (A) 06/27/2018 1630   BILIRUBINUR NEGATIVE 06/27/2018 1630   KETONESUR NEGATIVE 06/27/2018 1630   PROTEINUR NEGATIVE 06/27/2018 1630   NITRITE NEGATIVE 06/27/2018 1630   LEUKOCYTESUR NEGATIVE 06/27/2018 1630   Sepsis Labs: !!!!!!!!!!!!!!!!!!!!!!!!!!!!!!!!!!!!!!!!!!!! @LABRCNTIP (procalcitonin:4,lacticidven:4) )No results found for this or any previous visit (from the past 240 hour(s)).   Radiological Exams on Admission: Ct Abdomen Pelvis W Contrast  Result Date: 06/27/2018 CLINICAL DATA:  Upper abdominal pain beginning this morning, nausea and diarrhea. History of biliary colic and hypertension. EXAM: CT ABDOMEN AND PELVIS WITH CONTRAST TECHNIQUE: Multidetector CT imaging of the abdomen and pelvis was performed using the standard protocol following bolus administration of intravenous contrast. CONTRAST:  ISOVUE-300  IOPAMIDOL (ISOVUE-300) INJECTION 61% COMPARISON:  Pelvic ultrasound June 19, 2018 and abdominal ultrasound June 19, 2018. FINDINGS: LOWER CHEST: Lung bases are clear. Included heart size is normal. No pericardial effusion. HEPATOBILIARY: Mild gallbladder distention and pericholecystic fluid. No CT findings of cholelithiasis though ultrasound is more sensitive. Minimal intrahepatic biliary dilatation. Focal fatty infiltration about the falciform ligament. PANCREAS: Normal. SPLEEN: 14 mm homogeneously hypodense cyst or lymphangioma in the spleen. ADRENALS/URINARY TRACT: Kidneys are orthotopic, demonstrating symmetric enhancement. No nephrolithiasis, hydronephrosis or solid renal masses. The unopacified ureters are normal in course and caliber. Urinary bladder is adequately distended and unremarkable. Normal adrenal glands. STOMACH/BOWEL: The stomach, small and large bowel are normal in course and caliber without inflammatory changes. Small and large bowel air-fluid levels. Fluid distended 10 mm appendix without wall thickening or periappendiceal inflammation. VASCULAR/LYMPHATIC: Aortoiliac vessels are normal in course and caliber. No lymphadenopathy by CT size criteria. REPRODUCTIVE: 3 cm homogeneously hypodense benign-appearing RIGHT adnexal cyst. Air density in the vagina consistent with a tampon. OTHER: No intraperitoneal  free fluid or free air. MUSCULOSKELETAL: Nonacute. IMPRESSION: 1. Pericholecystic fluid and mild intrahepatic biliary dilatation concerning for acute cholecystitis. 2. Small and large bowel air-fluid levels seen with enteritis. 3. Enlarged 10 mm appendix without corroborative findings of acute appendicitis. This may be related to patient's enteritis. Electronically Signed   By: Awilda Metroourtnay  Bloomer M.D.   On: 06/27/2018 18:11    Old chart reviewed Case discussed by Dr. Ranae PalmsYelverton in the ED  Assessment/Plan 27 year old female with acute cholecystitis Principal Problem:   Acute  cholecystitis-abdominal exam benign.  Keep n.p.o. after midnight for surgery in the morning.  Hold anticoagulants.  PRN pain meds ordered.  Active Problems:   Hypertension-continue home meds Obesity-noted   DVT prophylaxis: SCDs Code Status: Full Family Communication: None Disposition Plan: Likely tomorrow Consults called: General surgery Admission status: Observation   Leah Palmer A MD [facility]  If 7PM-7AM, please contact night-coverage [email or web address] Password [email or web address]  06/27/2018, 6:45 PM

## 2018-06-27 NOTE — ED Notes (Signed)
Patient transported to CT 

## 2018-06-27 NOTE — ED Notes (Signed)
Pt placed on cardiac monitor 

## 2018-06-28 ENCOUNTER — Observation Stay (HOSPITAL_COMMUNITY): Payer: Self-pay | Admitting: Anesthesiology

## 2018-06-28 ENCOUNTER — Encounter: Payer: Self-pay | Admitting: Physician Assistant

## 2018-06-28 ENCOUNTER — Other Ambulatory Visit: Payer: Self-pay

## 2018-06-28 ENCOUNTER — Encounter (HOSPITAL_COMMUNITY)
Admission: EM | Disposition: A | Discharge status: Home / Self Care | Payer: Self-pay | Source: Home / Self Care | Attending: Family Medicine

## 2018-06-28 ENCOUNTER — Encounter (HOSPITAL_COMMUNITY): Payer: Self-pay | Admitting: General Surgery

## 2018-06-28 DIAGNOSIS — K810 Acute cholecystitis: Secondary | ICD-10-CM

## 2018-06-28 DIAGNOSIS — I10 Essential (primary) hypertension: Secondary | ICD-10-CM

## 2018-06-28 HISTORY — PX: CHOLECYSTECTOMY: SHX55

## 2018-06-28 LAB — COMPREHENSIVE METABOLIC PANEL
ALT: 42 U/L (ref 0–44)
AST: 41 U/L (ref 15–41)
Albumin: 3.3 g/dL — ABNORMAL LOW (ref 3.5–5.0)
Alkaline Phosphatase: 81 U/L (ref 38–126)
Anion gap: 6 (ref 5–15)
BUN: 6 mg/dL (ref 6–20)
CHLORIDE: 101 mmol/L (ref 98–111)
CO2: 31 mmol/L (ref 22–32)
CREATININE: 0.7 mg/dL (ref 0.44–1.00)
Calcium: 8.2 mg/dL — ABNORMAL LOW (ref 8.9–10.3)
GFR calc non Af Amer: >60 mL/min (ref >60)
Glucose, Bld: 96 mg/dL (ref 70–99)
Potassium: 3 mmol/L — ABNORMAL LOW (ref 3.5–5.1)
SODIUM: 138 mmol/L (ref 135–145)
Total Bilirubin: 0.5 mg/dL (ref 0.3–1.2)
Total Protein: 6.9 g/dL (ref 6.5–8.1)

## 2018-06-28 LAB — CBC
HCT: 33.9 % — ABNORMAL LOW (ref 36.0–46.0)
Hemoglobin: 10.8 g/dL — ABNORMAL LOW (ref 12.0–15.0)
MCH: 27.3 pg (ref 26.0–34.0)
MCHC: 31.9 g/dL (ref 30.0–36.0)
MCV: 85.8 fL (ref 78.0–100.0)
Platelets: 337 10*3/uL (ref 150–400)
RBC: 3.95 MIL/uL (ref 3.87–5.11)
RDW: 13.5 % (ref 11.5–15.5)
WBC: 9.7 10*3/uL (ref 4.0–10.5)

## 2018-06-28 LAB — MAGNESIUM: MAGNESIUM: 1.8 mg/dL (ref 1.7–2.4)

## 2018-06-28 SURGERY — LAPAROSCOPIC CHOLECYSTECTOMY
Anesthesia: General

## 2018-06-28 MED ORDER — FENTANYL CITRATE (PF) 250 MCG/5ML IJ SOLN
INTRAMUSCULAR | Status: AC
  Filled 2018-06-28: qty 5

## 2018-06-28 MED ORDER — LABETALOL HCL 5 MG/ML IV SOLN
10.0000 mg | Freq: Once | INTRAVENOUS | Status: AC
  Administered 2018-06-28: 10 mg via INTRAVENOUS
  Filled 2018-06-28: qty 4

## 2018-06-28 MED ORDER — ROCURONIUM 10MG/ML (10ML) SYRINGE FOR MEDFUSION PUMP - OPTIME
INTRAVENOUS | Status: DC | PRN
  Administered 2018-06-28: 20 mg via INTRAVENOUS
  Administered 2018-06-28: 23 mg via INTRAVENOUS
  Administered 2018-06-28: 7 mg via INTRAVENOUS

## 2018-06-28 MED ORDER — GLYCOPYRROLATE 0.2 MG/ML IJ SOLN
INTRAMUSCULAR | Status: DC | PRN
  Administered 2018-06-28: .7 mg via INTRAVENOUS

## 2018-06-28 MED ORDER — CHLORHEXIDINE GLUCONATE CLOTH 2 % EX PADS
6.0000 | MEDICATED_PAD | Freq: Once | CUTANEOUS | Status: AC
  Administered 2018-06-28: 6 via TOPICAL

## 2018-06-28 MED ORDER — FENTANYL CITRATE (PF) 100 MCG/2ML IJ SOLN
50.0000 ug | Freq: Once | INTRAMUSCULAR | Status: AC
  Administered 2018-06-28: 50 ug via INTRAVENOUS

## 2018-06-28 MED ORDER — FENTANYL CITRATE (PF) 100 MCG/2ML IJ SOLN
25.0000 ug | INTRAMUSCULAR | Status: DC | PRN
  Administered 2018-06-28: 50 ug via INTRAVENOUS
  Filled 2018-06-28: qty 2

## 2018-06-28 MED ORDER — LIDOCAINE HCL (CARDIAC) PF 50 MG/5ML IV SOSY
PREFILLED_SYRINGE | INTRAVENOUS | Status: DC | PRN
  Administered 2018-06-28: 40 mg via INTRAVENOUS

## 2018-06-28 MED ORDER — HYDROCODONE-ACETAMINOPHEN 7.5-325 MG PO TABS: 1.0000 | ORAL_TABLET | Freq: Once | ORAL | Status: DC | PRN

## 2018-06-28 MED ORDER — POTASSIUM CHLORIDE 10 MEQ/100ML IV SOLN
10.0000 meq | INTRAVENOUS | Status: AC
  Administered 2018-06-28: 10 meq via INTRAVENOUS
  Filled 2018-06-28: qty 100

## 2018-06-28 MED ORDER — POTASSIUM CHLORIDE 10 MEQ/100ML IV SOLN
10.0000 meq | INTRAVENOUS | Status: DC
  Filled 2018-06-28 (×3): qty 100

## 2018-06-28 MED ORDER — PROPOFOL 10 MG/ML IV BOLUS
INTRAVENOUS | Status: DC | PRN
  Administered 2018-06-28: 200 mg via INTRAVENOUS

## 2018-06-28 MED ORDER — HEMOSTATIC AGENTS (NO CHARGE) OPTIME
TOPICAL | Status: DC | PRN
  Administered 2018-06-28 (×2): 1 via TOPICAL

## 2018-06-28 MED ORDER — MIDAZOLAM HCL 5 MG/5ML IJ SOLN
INTRAMUSCULAR | Status: DC | PRN
  Administered 2018-06-28: 1 mg via INTRAVENOUS

## 2018-06-28 MED ORDER — FENTANYL CITRATE (PF) 100 MCG/2ML IJ SOLN
INTRAMUSCULAR | Status: AC
  Filled 2018-06-28: qty 2

## 2018-06-28 MED ORDER — KETOROLAC TROMETHAMINE 30 MG/ML IJ SOLN
30.0000 mg | Freq: Four times a day (QID) | INTRAMUSCULAR | Status: DC | PRN
  Administered 2018-06-29: 30 mg via INTRAVENOUS
  Filled 2018-06-28: qty 1

## 2018-06-28 MED ORDER — SODIUM CHLORIDE 0.9 % IR SOLN
Status: DC | PRN
  Administered 2018-06-28: 3000 mL

## 2018-06-28 MED ORDER — LACTATED RINGERS IV SOLN
INTRAVENOUS | Status: DC
  Administered 2018-06-28 (×2): via INTRAVENOUS

## 2018-06-28 MED ORDER — SODIUM CHLORIDE 0.9 % IR SOLN
Status: DC | PRN
  Administered 2018-06-28: 1000 mL

## 2018-06-28 MED ORDER — BUPIVACAINE HCL (PF) 0.5 % IJ SOLN
INTRAMUSCULAR | Status: DC | PRN
  Administered 2018-06-28: 10 mL

## 2018-06-28 MED ORDER — MORPHINE SULFATE (PF) 2 MG/ML IV SOLN
2.0000 mg | INTRAVENOUS | Status: DC | PRN
  Administered 2018-06-28 – 2018-06-29 (×4): 2 mg via INTRAVENOUS
  Filled 2018-06-28 (×4): qty 1

## 2018-06-28 MED ORDER — SUCCINYLCHOLINE 20MG/ML (10ML) SYRINGE FOR MEDFUSION PUMP - OPTIME
INTRAMUSCULAR | Status: DC | PRN
  Administered 2018-06-28: 120 mg via INTRAVENOUS

## 2018-06-28 MED ORDER — LABETALOL HCL 5 MG/ML IV SOLN
10.0000 mg | INTRAVENOUS | Status: DC | PRN
  Administered 2018-06-28 – 2018-06-29 (×5): 10 mg via INTRAVENOUS
  Filled 2018-06-28 (×6): qty 4

## 2018-06-28 MED ORDER — FENTANYL CITRATE (PF) 100 MCG/2ML IJ SOLN
INTRAMUSCULAR | Status: DC | PRN
  Administered 2018-06-28 (×6): 50 ug via INTRAVENOUS

## 2018-06-28 MED ORDER — SODIUM CHLORIDE 0.9 % IV SOLN
INTRAVENOUS | Status: AC
  Filled 2018-06-28: qty 2

## 2018-06-28 MED ORDER — POTASSIUM CHLORIDE CRYS ER 20 MEQ PO TBCR
40.0000 meq | EXTENDED_RELEASE_TABLET | Freq: Once | ORAL | Status: AC
  Administered 2018-06-28: 40 meq via ORAL
  Filled 2018-06-28: qty 2

## 2018-06-28 MED ORDER — HYDRALAZINE HCL 20 MG/ML IJ SOLN: 10.0000 mg | INTRAMUSCULAR | Status: DC | PRN

## 2018-06-28 MED ORDER — PROPOFOL 10 MG/ML IV BOLUS
INTRAVENOUS | Status: AC
  Filled 2018-06-28: qty 40

## 2018-06-28 MED ORDER — NEOSTIGMINE METHYLSULFATE 10 MG/10ML IV SOLN
INTRAVENOUS | Status: DC | PRN
  Administered 2018-06-28: 4 mg via INTRAVENOUS

## 2018-06-28 MED ORDER — FENTANYL CITRATE (PF) 100 MCG/2ML IJ SOLN: 25.0000 ug | Freq: Once | INTRAMUSCULAR | Status: DC

## 2018-06-28 MED ORDER — MIDAZOLAM HCL 2 MG/2ML IJ SOLN
INTRAMUSCULAR | Status: AC
  Filled 2018-06-28: qty 2

## 2018-06-28 MED ORDER — ONDANSETRON HCL 4 MG/2ML IJ SOLN
INTRAMUSCULAR | Status: DC | PRN
  Administered 2018-06-28 (×2): 4 mg via INTRAVENOUS

## 2018-06-28 MED ORDER — HYDRALAZINE HCL 20 MG/ML IJ SOLN
10.0000 mg | INTRAMUSCULAR | Status: DC
  Administered 2018-06-28: 10 mg via INTRAVENOUS
  Filled 2018-06-28: qty 1

## 2018-06-28 MED ORDER — BUPIVACAINE HCL (PF) 0.5 % IJ SOLN
INTRAMUSCULAR | Status: AC
  Filled 2018-06-28: qty 30

## 2018-06-28 MED ORDER — SODIUM CHLORIDE 0.9 % IV SOLN
2.0000 g | INTRAVENOUS | Status: AC
  Administered 2018-06-28: 2 g via INTRAVENOUS

## 2018-06-28 SURGICAL SUPPLY — 57 items
APPLICATOR ARISTA FLEXITIP XL (MISCELLANEOUS) ×3 IMPLANT
APPLIER CLIP ROT 10 11.4 M/L (STAPLE) ×3
BAG RETRIEVAL 10 (BASKET) ×1
BAG RETRIEVAL 10MM (BASKET) ×1
BLADE SURG 15 STRL LF DISP TIS (BLADE) ×1 IMPLANT
BLADE SURG 15 STRL SS (BLADE) ×2
CHLORAPREP W/TINT 26ML (MISCELLANEOUS) ×3 IMPLANT
CLIP APPLIE ROT 10 11.4 M/L (STAPLE) ×1 IMPLANT
CLOTH BEACON ORANGE TIMEOUT ST (SAFETY) ×3 IMPLANT
COVER LIGHT HANDLE STERIS (MISCELLANEOUS) ×6 IMPLANT
CUTTER FLEX LINEAR 45M (STAPLE) ×3 IMPLANT
DECANTER SPIKE VIAL GLASS SM (MISCELLANEOUS) ×3 IMPLANT
DERMABOND ADVANCED (GAUZE/BANDAGES/DRESSINGS) ×2
DERMABOND ADVANCED .7 DNX12 (GAUZE/BANDAGES/DRESSINGS) ×1 IMPLANT
ELECT REM PT RETURN 9FT ADLT (ELECTROSURGICAL) ×3
ELECTRODE REM PT RTRN 9FT ADLT (ELECTROSURGICAL) ×1 IMPLANT
EVACUATOR DRAINAGE 10X20 100CC (DRAIN) ×1 IMPLANT
EVACUATOR SILICONE 100CC (DRAIN) ×2
FILTER SMOKE EVAC LAPAROSHD (FILTER) ×3 IMPLANT
GLOVE BIO SURGEON STRL SZ 6.5 (GLOVE) ×2 IMPLANT
GLOVE BIO SURGEON STRL SZ7 (GLOVE) ×3 IMPLANT
GLOVE BIO SURGEONS STRL SZ 6.5 (GLOVE) ×1
GLOVE BIOGEL PI IND STRL 6.5 (GLOVE) ×1 IMPLANT
GLOVE BIOGEL PI IND STRL 7.0 (GLOVE) ×3 IMPLANT
GLOVE BIOGEL PI INDICATOR 6.5 (GLOVE) ×2
GLOVE BIOGEL PI INDICATOR 7.0 (GLOVE) ×6
GLOVE ECLIPSE 6.5 STRL STRAW (GLOVE) ×3 IMPLANT
GOWN STRL REUS W/TWL LRG LVL3 (GOWN DISPOSABLE) ×9 IMPLANT
HEMOSTAT ARISTA ABSORB 3G PWDR (MISCELLANEOUS) ×3 IMPLANT
HEMOSTAT SNOW SURGICEL 2X4 (HEMOSTASIS) ×3 IMPLANT
INST SET LAPROSCOPIC AP (KITS) ×3 IMPLANT
IV NS IRRIG 3000ML ARTHROMATIC (IV SOLUTION) ×3 IMPLANT
KIT TURNOVER KIT A (KITS) ×3 IMPLANT
MANIFOLD NEPTUNE II (INSTRUMENTS) ×3 IMPLANT
NEEDLE INSUFFLATION 14GA 120MM (NEEDLE) ×3 IMPLANT
NS IRRIG 1000ML POUR BTL (IV SOLUTION) ×3 IMPLANT
PACK LAP CHOLE LZT030E (CUSTOM PROCEDURE TRAY) ×3 IMPLANT
PAD ARMBOARD 7.5X6 YLW CONV (MISCELLANEOUS) ×3 IMPLANT
PENCIL HANDSWITCHING (ELECTRODE) ×3 IMPLANT
RELOAD STAPLE TA45 3.5 REG BLU (ENDOMECHANICALS) ×3 IMPLANT
SET BASIN LINEN APH (SET/KITS/TRAYS/PACK) ×3 IMPLANT
SET TUBE IRRIG SUCTION NO TIP (IRRIGATION / IRRIGATOR) ×3 IMPLANT
SLEEVE ENDOPATH XCEL 5M (ENDOMECHANICALS) ×3 IMPLANT
SPONGE DRAIN TRACH 4X4 STRL 2S (GAUZE/BANDAGES/DRESSINGS) ×3 IMPLANT
SUT ETHILON 3 0 FSL (SUTURE) ×3 IMPLANT
SUT MNCRL AB 4-0 PS2 18 (SUTURE) ×3 IMPLANT
SUT VICRYL 0 UR6 27IN ABS (SUTURE) ×9 IMPLANT
SYS BAG RETRIEVAL 10MM (BASKET) ×1
SYSTEM BAG RETRIEVAL 10MM (BASKET) ×1 IMPLANT
TROCAR ENDO BLADELESS 11MM (ENDOMECHANICALS) ×3 IMPLANT
TROCAR XCEL BLUNT TIP 100MML (ENDOMECHANICALS) ×3 IMPLANT
TROCAR XCEL NON-BLD 5MMX100MML (ENDOMECHANICALS) ×3 IMPLANT
TROCAR XCEL UNIV SLVE 11M 100M (ENDOMECHANICALS) ×3 IMPLANT
TUBE CONNECTING 12'X1/4 (SUCTIONS) ×1
TUBE CONNECTING 12X1/4 (SUCTIONS) ×2 IMPLANT
TUBING INSUFFLATION (TUBING) ×3 IMPLANT
WARMER LAPAROSCOPE (MISCELLANEOUS) ×3 IMPLANT

## 2018-06-28 NOTE — Progress Notes (Signed)
Patient reported pain to surgical site. Orders for toradol and morphine PRN severe pain. Discussed with Dr.Bridges while on unit this evening. Stated toradol should be given for mild pain. Also notified her of elevated BP 160/97 and HR 98-101. Stated patient should receive labetalol as ordered PRN high blood pressure. Nursing to continue monitoring. Leah RegalAshley Bree Heinzelman, RN

## 2018-06-28 NOTE — Consult Note (Addendum)
[facility] Consult  Reason for Consult: Cholelithiasis/ cholecystitis  Referring Physician:  Dr. Wynetta Emery  Chief Complaint    Abdominal Pain      Leah Palmer is a 27 y.o. female.  HPI: Leah Palmer is a 27 yo with a history of HTN that is uncontrolled and biliary colic that came to the ED overnight with complaints of RUQ pain and some diarrhea. She denies any new sick contacts but has had some diarrhea that resolved. She had a CT scan that demonstrated some thickening of her gallbladder and some enteritis. She has had prior episodes of biliary colic for grease food intake, and has tried to cut back on this type of food.   She has seen surgery in the past for her gallbladder but did not get her  Gallbladder out due to noncompliance with her BP meds per report. She currently is having RUQ pain, but no nausea or vomiting and her diarrhea has resolved.   Past Medical History:  Diagnosis Date  . Biliary colic   . Hypertension   . Noncompliance with medication regimen     Past Surgical History:  Procedure Laterality Date  . NO PAST SURGERIES      Family History  Problem Relation Age of Onset  . Gallbladder disease Mother     Social History   Tobacco Use  . Smoking status: Never Smoker  . Smokeless tobacco: Never Used  Substance Use Topics  . Alcohol use: Not Currently    Frequency: Never  . Drug use: Never    Medications:  I have reviewed the patient's current medications. Prior to Admission:  Medications Prior to Admission  Medication Sig Dispense Refill Last Dose  . HYDROcodone-acetaminophen (NORCO/VICODIN) 5-325 MG tablet 1 or 2 tabs PO q6 hours prn pain (Patient taking differently: Take 1-2 tablets by mouth every 6 (six) hours as needed for moderate pain or severe pain. ) 12 tablet 0 06/27/2018 at Unknown time  . lisinopril-hydrochlorothiazide (PRINZIDE,ZESTORETIC) 20-25 MG tablet Take 1 tablet by mouth daily.  1 06/27/2018 at Unknown time  . ondansetron  (ZOFRAN ODT) 4 MG disintegrating tablet Take 1 tablet (4 mg total) by mouth every 8 (eight) hours as needed for nausea or vomiting. 6 tablet 0 Past Week at Unknown time   Scheduled: . hydrALAZINE  10 mg Intravenous Q4H  . lisinopril  20 mg Oral Daily   Continuous: . 0.9 % NaCl with KCl 20 mEq / L 75 mL/hr at 06/28/18 0952  . potassium chloride 10 mEq (06/28/18 0952)   ZJQ:BHALPFXTK, labetalol, ondansetron **OR** ondansetron (ZOFRAN) IV, oxyCODONE  No Known Allergies   ROS:  A comprehensive review of systems was negative except for: Gastrointestinal: positive for abdominal pain and RUQ  Blood pressure (!) 153/97, pulse 92, temperature 98.9 F (37.2 C), temperature source Oral, resp. rate 18, height 5' 1"  (1.549 m), weight 251 lb 15.8 oz (114.3 kg), last menstrual period 06/16/2018, SpO2 97 %. Physical Exam  Constitutional: She is oriented to person, place, and time. She appears well-developed and well-nourished. She does not appear ill.  HENT:  Head: Normocephalic and atraumatic.  Eyes: Pupils are equal, round, and reactive to light.  Pulmonary/Chest: Effort normal.  Abdominal: Soft. Normal appearance. She exhibits no distension. There is tenderness in the right upper quadrant. There is no rebound and no guarding.  Musculoskeletal: Normal range of motion.  Neurological: She is alert and oriented to person, place, and time.  Skin: Skin is warm and dry.  Psychiatric: She  has a normal mood and affect. Her behavior is normal.  Vitals reviewed.   Results: Results for orders placed or performed during the hospital encounter of 06/27/18 (from the past 48 hour(s))  Lipase, blood     Status: None   Collection Time: 06/27/18 12:38 PM  Result Value Ref Range   Lipase 36 11 - 51 U/L    Comment: Performed at [facility], [address], [place], [place] [address]  Comprehensive metabolic panel     Status: Abnormal   Collection Time: 06/27/18 12:38 PM  Result Value Ref Range   Sodium 140  135 - 145 mmol/L   Potassium 2.9 (L) 3.5 - 5.1 mmol/L   Chloride 99 98 - 111 mmol/L   CO2 34 (H) 22 - 32 mmol/L   Glucose, Bld 109 (H) 70 - 99 mg/dL   BUN 8 6 - 20 mg/dL   Creatinine, Ser 0.73 0.44 - 1.00 mg/dL   Calcium 9.2 8.9 - 10.3 mg/dL   Total Protein 7.4 6.5 - 8.1 g/dL   Albumin 3.6 3.5 - 5.0 g/dL   AST 16 15 - 41 U/L   ALT 17 0 - 44 U/L   Alkaline Phosphatase 75 38 - 126 U/L   Total Bilirubin 0.4 0.3 - 1.2 mg/dL   GFR calc non Af Amer >60 >60 mL/min   GFR calc Af Amer >60 >60 mL/min    Comment: (NOTE) The eGFR has been calculated using the CKD EPI equation. This calculation has not been validated in all clinical situations. eGFR's persistently <60 mL/min signify possible Chronic Kidney Disease.    Anion gap 7 5 - 15    Comment: Performed at [facility], [address], [place], [place] [address]  CBC     Status: Abnormal   Collection Time: 06/27/18 12:38 PM  Result Value Ref Range   WBC 6.8 4.0 - 10.5 K/uL   RBC 4.09 3.87 - 5.11 MIL/uL   Hemoglobin 11.2 (L) 12.0 - 15.0 g/dL   HCT 35.2 (L) 36.0 - 46.0 %   MCV 86.1 78.0 - 100.0 fL   MCH 27.4 26.0 - 34.0 pg   MCHC 31.8 30.0 - 36.0 g/dL   RDW 13.4 11.5 - 15.5 %   Platelets 337 150 - 400 K/uL    Comment: Performed at [facility], [address] [place], [place] [address]  Magnesium     Status: None   Collection Time: 06/27/18 12:38 PM  Result Value Ref Range   Magnesium 1.9 1.7 - 2.4 mg/dL    Comment: Performed at [facility], [address], [place], [place] [address]  Urinalysis, Routine w reflex microscopic     Status: Abnormal   Collection Time: 06/27/18  4:30 PM  Result Value Ref Range   Color, Urine STRAW (A) YELLOW   APPearance CLEAR CLEAR   Specific Gravity, Urine 1.009 1.005 - 1.030   pH 8.0 5.0 - 8.0   Glucose, UA NEGATIVE NEGATIVE mg/dL   Hgb urine dipstick MODERATE (A) NEGATIVE   Bilirubin Urine NEGATIVE NEGATIVE   Ketones, ur NEGATIVE NEGATIVE mg/dL   Protein, ur NEGATIVE NEGATIVE mg/dL   Nitrite  NEGATIVE NEGATIVE   Leukocytes, UA NEGATIVE NEGATIVE   WBC, UA 0-5 0 - 5 WBC/hpf   Bacteria, UA NONE SEEN NONE SEEN   Squamous Epithelial / LPF 0-5 0 - 5   Mucus PRESENT     Comment: Performed at [facility], [address] [place], [place] [address]  Pregnancy, urine  Status: None   Collection Time: 06/27/18  4:30 PM  Result Value Ref Range   Preg Test, Ur NEGATIVE NEGATIVE    Comment:        THE SENSITIVITY OF THIS METHODOLOGY IS >20 mIU/mL. Performed at [facility], [address] [place], [place] [address]   Surgical PCR screen     Status: None   Collection Time: 06/27/18  8:19 PM  Result Value Ref Range   MRSA, PCR NEGATIVE NEGATIVE   Staphylococcus aureus NEGATIVE NEGATIVE    Comment: (NOTE) The Xpert SA Assay (FDA approved for NASAL specimens in patients 5 years of age and older), is one component of a comprehensive surveillance program. It is not intended to diagnose infection nor to guide or monitor treatment. Performed at [facility], [address] [place], [place] [address]   Comprehensive metabolic panel     Status: Abnormal   Collection Time: 06/28/18  6:03 AM  Result Value Ref Range   Sodium 138 135 - 145 mmol/L   Potassium 3.0 (L) 3.5 - 5.1 mmol/L   Chloride 101 98 - 111 mmol/L   CO2 31 22 - 32 mmol/L   Glucose, Bld 96 70 - 99 mg/dL   BUN 6 6 - 20 mg/dL   Creatinine, Ser 0.70 0.44 - 1.00 mg/dL   Calcium 8.2 (L) 8.9 - 10.3 mg/dL   Total Protein 6.9 6.5 - 8.1 g/dL   Albumin 3.3 (L) 3.5 - 5.0 g/dL   AST 41 15 - 41 U/L   ALT 42 0 - 44 U/L   Alkaline Phosphatase 81 38 - 126 U/L   Total Bilirubin 0.5 0.3 - 1.2 mg/dL   GFR calc non Af Amer >60 >60 mL/min   GFR calc Af Amer >60 >60 mL/min    Comment: (NOTE) The eGFR has been calculated using the CKD EPI equation. This calculation has not been validated in all clinical situations. eGFR's persistently <60 mL/min signify possible Chronic Kidney Disease.    Anion gap 6 5 - 15    Comment: Performed at  [facility], [address], [place], [place] [address]  CBC     Status: Abnormal   Collection Time: 06/28/18  6:03 AM  Result Value Ref Range   WBC 9.7 4.0 - 10.5 K/uL   RBC 3.95 3.87 - 5.11 MIL/uL   Hemoglobin 10.8 (L) 12.0 - 15.0 g/dL   HCT 33.9 (L) 36.0 - 46.0 %   MCV 85.8 78.0 - 100.0 fL   MCH 27.3 26.0 - 34.0 pg   MCHC 31.9 30.0 - 36.0 g/dL   RDW 13.5 11.5 - 15.5 %   Platelets 337 150 - 400 K/uL    Comment: Performed at [facility], [address], [place], [place] [address]  Magnesium     Status: None   Collection Time: 06/28/18  6:03 AM  Result Value Ref Range   Magnesium 1.8 1.7 - 2.4 mg/dL    Comment: Performed at [facility], [address] [place], [place] [address]   Personally reviewed- some fluid around gallbladder, some thickened small bowel and clinical diarrhea recently, known gallstones   Ct Abdomen Pelvis W Contrast  Result Date: 06/27/2018 CLINICAL DATA:  Upper abdominal pain beginning this morning, nausea and diarrhea. History of biliary colic and hypertension. EXAM: CT ABDOMEN AND PELVIS WITH CONTRAST TECHNIQUE: Multidetector CT imaging of the abdomen and pelvis was performed using the standard protocol following bolus administration of intravenous contrast. CONTRAST:  163m ISOVUE-300 IOPAMIDOL (ISOVUE-300) INJECTION  61% COMPARISON:  Pelvic ultrasound June 19, 2018 and abdominal ultrasound June 19, 2018. FINDINGS: LOWER CHEST: Lung bases are clear. Included heart size is normal. No pericardial effusion. HEPATOBILIARY: Mild gallbladder distention and pericholecystic fluid. No CT findings of cholelithiasis though ultrasound is more sensitive. Minimal intrahepatic biliary dilatation. Focal fatty infiltration about the falciform ligament. PANCREAS: Normal. SPLEEN: 14 mm homogeneously hypodense cyst or lymphangioma in the spleen. ADRENALS/URINARY TRACT: Kidneys are orthotopic, demonstrating symmetric enhancement. No nephrolithiasis, hydronephrosis or solid renal masses. The  unopacified ureters are normal in course and caliber. Urinary bladder is adequately distended and unremarkable. Normal adrenal glands. STOMACH/BOWEL: The stomach, small and large bowel are normal in course and caliber without inflammatory changes. Small and large bowel air-fluid levels. Fluid distended 10 mm appendix without wall thickening or periappendiceal inflammation. VASCULAR/LYMPHATIC: Aortoiliac vessels are normal in course and caliber. No lymphadenopathy by CT size criteria. REPRODUCTIVE: 3 cm homogeneously hypodense benign-appearing RIGHT adnexal cyst. Air density in the vagina consistent with a tampon. OTHER: No intraperitoneal free fluid or free air. MUSCULOSKELETAL: Nonacute. IMPRESSION: 1. Pericholecystic fluid and mild intrahepatic biliary dilatation concerning for acute cholecystitis. 2. Small and large bowel air-fluid levels seen with enteritis. 3. Enlarged 10 mm appendix without corroborative findings of acute appendicitis. This may be related to patient's enteritis. Electronically Signed   By: Elon Alas M.D.   On: 06/27/2018 18:11     Assessment & Plan:  Leah Palmer is a 27 y.o. female with cholecystitis possible based on RUQ tenderness and her CT scan. LFT wnl. Continues to return to the ED with pain complaints. Will proceed with removing her gallbladder.   [facility] admitted due to uncontrolled HTN  -OR today for Lap chole   PLAN: I counseled the patient about the indication, risks and benefits of laparoscopic cholecystectomy.  She understands there is a very small chance for bleeding, infection, injury to normal structures (including common bile duct), conversion to open surgery, persistent symptoms, evolution of postcholecystectomy diarrhea, need for secondary interventions, anesthesia reaction, cardiopulmonary issues and other risks not specifically detailed here. I described the expected recovery, the plan for follow-up and the restrictions during the recovery phase.  All  questions were answered.   All questions were answered to the satisfaction of the patient and family.    Virl Cagey 06/28/2018, 10:21 AM

## 2018-06-28 NOTE — Progress Notes (Signed)
Late entry: Patient had orders to receive KCL 10 mEq IV x 3 runs. First run started this am but having to infuse at slower rate for patient tolerance. Discussed with Dr.  Henreitta LeberBridges on rounds and notified her of K+ level 3 today. Stated okay and would proceed with surgery as planned. Notified pre-op nursing staff of K+ level, receiving first run of KCL and that Dr. Henreitta LeberBridges was aware. Also notified nursing staff of signed/held order for antibiotic pre-op that would need to be released in pre-op. SCDs remain on patient as ordered. Earnstine RegalAshley Jahzeel Poythress, RN

## 2018-06-28 NOTE — Anesthesia Postprocedure Evaluation (Signed)
Anesthesia Post Note  Patient: Leah Palmer  Procedure(s) Performed: LAPAROSCOPIC CHOLECYSTECTOMY (N/A )  Patient location during evaluation: PACU Anesthesia Type: General Level of consciousness: awake and alert and oriented Pain management: pain level controlled Vital Signs Assessment: post-procedure vital signs reviewed and stable Respiratory status: spontaneous breathing Cardiovascular status: blood pressure returned to baseline and stable Postop Assessment: no apparent nausea or vomiting Anesthetic complications: no     Last Vitals:  Vitals:   06/28/18 1619 06/28/18 1630  BP: (!) 156/94   Pulse: 92 94  Resp: 14 16  Temp: 37.2 C   SpO2: 100% 100%    Last Pain:  Vitals:   06/28/18 1630  TempSrc:   PainSc: Asleep                 Mozelle Remlinger

## 2018-06-28 NOTE — Progress Notes (Signed)
[facility]   Difficult gallbladder. Will need to stay overnight. JP in place. PRN for pain ordered. Diet ordered. Will likely remove JP tomorrow AM. No post op labs needed.  Algis GreenhouseLindsay Bridges, MD [facility] [address] [place], [place] [phone] (office)

## 2018-06-28 NOTE — Anesthesia Preprocedure Evaluation (Addendum)
Anesthesia Evaluation  Patient identified by MRN, date of birth, ID band Patient awake    Reviewed: Allergy & Precautions, NPO status , Patient's Chart, lab work & pertinent test results  Airway Mallampati: II  TM Distance: >3 FB Neck ROM: Full    Dental no notable dental hx.    Pulmonary neg pulmonary ROS,    Pulmonary exam normal breath sounds clear to auscultation       Cardiovascular Exercise Tolerance: Good hypertension, Pt. on medications negative cardio ROS Normal cardiovascular examI Rhythm:Regular Rate:Normal  Pt with DBP ~ 90-110, told by mother increasing BP meds and changing them  Lisinopril held this AM while NPO   Neuro/Psych negative neurological ROS  negative psych ROS   GI/Hepatic negative GI ROS, Neg liver ROS,   Endo/Other  negative endocrine ROS  Renal/GU negative Renal ROS  negative genitourinary   Musculoskeletal negative musculoskeletal ROS (+)   Abdominal   Peds negative pediatric ROS (+)  Hematology negative hematology ROS (+)   Anesthesia Other Findings MO BMI ~47  Reproductive/Obstetrics negative OB ROS                            Anesthesia Physical Anesthesia Plan  ASA: II  Anesthesia Plan: General   Post-op Pain Management:    Induction: Intravenous  PONV Risk Score and Plan:   Airway Management Planned: Oral ETT  Additional Equipment:   Intra-op Plan:   Post-operative Plan: Extubation in OR  Informed Consent: I have reviewed the patients History and Physical, chart, labs and discussed the procedure including the risks, benefits and alternatives for the proposed anesthesia with the patient or authorized representative who has indicated his/her understanding and acceptance.   Dental advisory given  Plan Discussed with: CRNA  Anesthesia Plan Comments:         Anesthesia Quick Evaluation

## 2018-06-28 NOTE — Anesthesia Procedure Notes (Signed)
Procedure Name: Intubation Date/Time: 06/28/2018 1:39 PM Performed by: Ollen Bowl, CRNA Pre-anesthesia Checklist: Patient identified, Patient being monitored, Timeout performed, Emergency Drugs available and Suction available Patient Re-evaluated:Patient Re-evaluated prior to induction Oxygen Delivery Method: Circle System Utilized Preoxygenation: Pre-oxygenation with 100% oxygen Induction Type: IV induction Ventilation: Mask ventilation without difficulty Laryngoscope Size: Mac and 3 Grade View: Grade II Tube type: Oral Tube size: 7.0 mm Number of attempts: 1 Airway Equipment and Method: stylet Placement Confirmation: ETT inserted through vocal cords under direct vision,  positive ETCO2 and breath sounds checked- equal and bilateral Secured at: 21 cm Tube secured with: Tape Dental Injury: Teeth and Oropharynx as per pre-operative assessment

## 2018-06-28 NOTE — Transfer of Care (Signed)
Immediate Anesthesia Transfer of Care Note  Patient: Leah Palmer  Procedure(s) Performed: LAPAROSCOPIC CHOLECYSTECTOMY (N/A )  Patient Location: PACU  Anesthesia Type:General  Level of Consciousness: awake  Airway & Oxygen Therapy: Patient Spontanous Breathing and Patient connected to face mask oxygen  Post-op Assessment: Report given to RN  Post vital signs: Reviewed and stable  Last Vitals:  Vitals Value Taken Time  BP    Temp    Pulse 92 06/28/2018  4:24 PM  Resp 13 06/28/2018  4:24 PM  SpO2 100 % 06/28/2018  4:24 PM  Vitals shown include unvalidated device data.  Last Pain:  Vitals:   06/28/18 1323  TempSrc:   PainSc: 5       Patients Stated Pain Goal: 5 (06/28/18 1110)  Complications: No apparent anesthesia complications

## 2018-06-28 NOTE — Progress Notes (Signed)
PROGRESS NOTE  Leah Palmer  ZOX:[id number]  DOB: 05/25/1991  DOA: 06/27/2018 PCP: Patient, No Pcp Per  Brief Admission Hx: Leah Palmer is a 27 y.o. female with medical history significant of obesity, hypertension, biliary colic comes in with persistent right upper quadrant abdominal pain.    MDM/Assessment & Plan:   1. Acute cholecystitis - I spoke with surgical team and they are planning to take patient to OR later today.   2. Essential hypertension - blood pressures are suboptimal, improved with IV labetalol, added scheduled IV hydralazine and PRN labetalol for SBP>150.  Follow.  Pt says she has been taking her oral medications regularly.  3. Hypokalemia - checking magnesium, IV KCL x 4 runs ordered.  Continue K in IVFs.   DVT prophylaxis: SCDs Code Status: Full  Family Communication: patient  Disposition Plan: pending surgery    Consultants:  surgery   Subjective: Pt says that she has pain only with eating and pressure on the abdomen.    Objective: Vitals:   06/27/18 2004 06/27/18 2006 06/28/18 0551 06/28/18 0806  BP: (!) 165/127  (!) 177/112 (!) 156/102  Pulse: (!) 103  99 87  Resp: 16  15 18   Temp: 98.4 F (36.9 C)  98.9 F (37.2 C)   TempSrc: Oral  Oral   SpO2: 100%  98% 97%  Weight:  114.3 kg (251 lb 15.8 oz)    Height:        Intake/Output Summary (Last 24 hours) at 06/28/2018 0855 Last data filed at 06/28/2018 0300 Gross per 24 hour  Intake 1711.25 ml  Output -  Net 1711.25 ml   Filed Weights   06/27/18 1204 06/27/18 2006  Weight: 110.7 kg (244 lb) 114.3 kg (251 lb 15.8 oz)   REVIEW OF SYSTEMS  As per history otherwise all reviewed and reported negative  Exam:  General exam: awake, alert, cooperative, NAD.  Respiratory system: Clear. No increased work of breathing. Cardiovascular system: S1 & S2 heard, RRR. No JVD, murmurs, gallops, clicks or pedal edema. Gastrointestinal system: Abdomen is nondistended, soft and RUQ and epigastric tenderness.  Normal bowel sounds heard. Central nervous system: Alert and oriented. No focal neurological deficits. Extremities: no CCE.  Data Reviewed: Basic Metabolic Panel: Recent Labs  Lab 06/27/18 1238 06/28/18 0603  NA 140 138  K 2.9* 3.0*  CL 99 101  CO2 34* 31  GLUCOSE 109* 96  BUN 8 6  CREATININE 0.73 0.70  CALCIUM 9.2 8.2*  MG 1.9  --    Liver Function Tests: Recent Labs  Lab 06/27/18 1238 06/28/18 0603  AST 16 41  ALT 17 42  ALKPHOS 75 81  BILITOT 0.4 0.5  PROT 7.4 6.9  ALBUMIN 3.6 3.3*   Recent Labs  Lab 06/27/18 1238  LIPASE 36   No results for input(s): AMMONIA in the last 168 hours. CBC: Recent Labs  Lab 06/27/18 1238 06/28/18 0603  WBC 6.8 9.7  HGB 11.2* 10.8*  HCT 35.2* 33.9*  MCV 86.1 85.8  PLT 337 337   Cardiac Enzymes: No results for input(s): CKTOTAL, CKMB, CKMBINDEX, TROPONINI in the last 168 hours. CBG (last 3)  No results for input(s): GLUCAP in the last 72 hours. Recent Results (from the past 240 hour(s))  Surgical PCR screen     Status: None   Collection Time: 06/27/18  8:19 PM  Result Value Ref Range Status   MRSA, PCR NEGATIVE NEGATIVE Final   Staphylococcus aureus NEGATIVE NEGATIVE Final    Comment: (NOTE) The  Xpert SA Assay (FDA approved for NASAL specimens in patients 90 years of age and older), is one component of a comprehensive surveillance program. It is not intended to diagnose infection nor to guide or monitor treatment. Performed at [facility], [address] [place], [place] [address]      Studies: Ct Abdomen Pelvis W Contrast  Result Date: 06/27/2018 CLINICAL DATA:  Upper abdominal pain beginning this morning, nausea and diarrhea. History of biliary colic and hypertension. EXAM: CT ABDOMEN AND PELVIS WITH CONTRAST TECHNIQUE: Multidetector CT imaging of the abdomen and pelvis was performed using the standard protocol following bolus administration of intravenous contrast. CONTRAST:  ISOVUE-300 IOPAMIDOL  (ISOVUE-300) INJECTION 61% COMPARISON:  Pelvic ultrasound June 19, 2018 and abdominal ultrasound June 19, 2018. FINDINGS: LOWER CHEST: Lung bases are clear. Included heart size is normal. No pericardial effusion. HEPATOBILIARY: Mild gallbladder distention and pericholecystic fluid. No CT findings of cholelithiasis though ultrasound is more sensitive. Minimal intrahepatic biliary dilatation. Focal fatty infiltration about the falciform ligament. PANCREAS: Normal. SPLEEN: 14 mm homogeneously hypodense cyst or lymphangioma in the spleen. ADRENALS/URINARY TRACT: Kidneys are orthotopic, demonstrating symmetric enhancement. No nephrolithiasis, hydronephrosis or solid renal masses. The unopacified ureters are normal in course and caliber. Urinary bladder is adequately distended and unremarkable. Normal adrenal glands. STOMACH/BOWEL: The stomach, small and large bowel are normal in course and caliber without inflammatory changes. Small and large bowel air-fluid levels. Fluid distended 10 mm appendix without wall thickening or periappendiceal inflammation. VASCULAR/LYMPHATIC: Aortoiliac vessels are normal in course and caliber. No lymphadenopathy by CT size criteria. REPRODUCTIVE: 3 cm homogeneously hypodense benign-appearing RIGHT adnexal cyst. Air density in the vagina consistent with a tampon. OTHER: No intraperitoneal free fluid or free air. MUSCULOSKELETAL: Nonacute. IMPRESSION: 1. Pericholecystic fluid and mild intrahepatic biliary dilatation concerning for acute cholecystitis. 2. Small and large bowel air-fluid levels seen with enteritis. 3. Enlarged 10 mm appendix without corroborative findings of acute appendicitis. This may be related to patient's enteritis. Electronically Signed   By: Awilda Metro M.D.   On: 06/27/2018 18:11   Scheduled Meds: . hydrALAZINE  10 mg Intravenous Q4H  . lisinopril  20 mg Oral Daily   Continuous Infusions: . 0.9 % NaCl with KCl 20 mEq / L 75 mL/hr at 06/27/18 2011  .  potassium chloride      Principal Problem:   Acute cholecystitis Active Problems:   Hypertension  Time spent:   Standley Dakins, MD, FAAFP [facility] Pager [phone]  If 7PM-7AM, please contact night-coverage [email or web address] Password [email or web address] 06/28/2018, 8:55 AM    LOS: 0 days

## 2018-06-28 NOTE — Progress Notes (Signed)
Late entry: notified Dr. Laural BenesJohnson patient was unable to tolerate KCL runs IV earlier this morning without infusing with IVF and at slower rate, so she received first run of 4 ordered prior to surgery and none in preop or recovery per report. Stated he would change to PO dose of potassium since she has regular diet ordered. Patient attempting to eat some of dinner tray, nursing to administer potassium as ordered once she has eaten something. Earnstine RegalAshley Addilynn Mowrer, RN

## 2018-06-28 NOTE — Op Note (Addendum)
Operative Note   Preoperative Diagnosis: Acute cholecystitis, Symptomatic cholelithiasis   Postoperative Diagnosis: Same   Procedure(s) Performed: Laparoscopic cholecystectomy   Surgeon: Lillia Abed C. Henreitta Leber, MD   Assistants: No qualified resident was available   Anesthesia: General endotracheal   Anesthesiologist:  Dr. Lemont Fillers    Specimens: Gallbladder    Estimated Blood Loss: Minimal    Blood Replacement: None    Complications: None    Operative Findings: Inflamed, distended gallbladder with short, thick cystic duct; fatty soft/ enlarged liver    Procedure: The patient was taken to the operating room and placed supine. General endotracheal anesthesia was induced. Intravenous antibiotics were administered per protocol. An orogastric tube positioned to decompress the stomach. The abdomen was prepared and draped in the usual sterile fashion.    A supraumbilical  incision was made and a Veress technique was utilized to attempt to achieve pneumoperitoneum. Given the patient's size, each time we felt that the Veress was intraabdominal with the saline drop test, the insufflation pressures would peak abruptly, so the Veress was removed.  This was also attempted in the left upper quadrant after verifying the OG was in place, and again the saline drop test confirmed entry to the abdominal cavity but the initially low insuflation pressures peaked rapidly without much flow in to the abdomen. Given this, the Veress was aborted, and the supraumbilical incision was enlarged.  The fascia was incised after retraction of significant abdominal wall adipose, and the peritoneum was indeed expanded with gas as we were intraperitoneal during the insufflation. 0 Vicryl stay sutures were placed on either side of the vertical incision that was made through the fascia, and a Hasson trocar was used to achieve pneumoperitoneum to 17 mmHg with carbon dioxide as the patient's abdominal wall weight was preventing  appropriate insufflation at normal pressures and also likely caused the rapid peak in the pressures during the Veress attempts.  The area inferior to the supraumbilical incision was inspected and no signs of injury to the bowel or omentum were noted, and the left upper quadrant was inspected and no signs of injury were noted.  Remaining trocars were placed under direct vision. Two 5 mm ports were placed in the right abdomen, between the anterior axillary and midclavicular line.  A final 11 mm port was placed through the mid-epigastrium, near the falciform ligament.  All of these ports were almost hubbed due to the patient's abdominal girth.    The gallbladder was distended and acutely inflamed. The gallbladder was decompressed and cloudy bile was evacuated from the gallbladder along with some stones.     The gallbladder fundus was elevated cephalad but this was limited by the large, fatty /soft liver. The gallbladder was intrahepatic at the top and mid portions and also very long with a difficult to grasp infundibulum.  The lateral walls of the gallbladder and the gallbladder rind were dissected with cautery to allow for easier retraction of the infundibulum. We ensured that the patient's OG was working appropriately and that the patient was in as much reverse Trendelenburg and right side up as possible. Still with all of these maneuvers the gallbladder was difficult to fully bring into the normal position for dissection of the cystic duct and artery.  Ultimately, I was able to score the rind over the anterior gallbladder at the level of the lymph node, and was able to retract the infundibulum laterally.  The gallbladder/cystic duct junction was skeletonized as much as possible as the cystic duct was very  short and broad.  I dissected out the space between the cystic duct and the inferior border of the gallbladder as far as possible as the duct did run along the inferior edge for a few millimeters but this only  added minimal length from the common bile duct.  The cystic artery noted in the triangle of Calot and was also skeletonized.  With continued liberal medial and lateral dissection and dissection of the inferior edge of the gallbladder, the critical view of safety was achieved.    The cystic artery were doubly clipped and divided. The cystic duct was too thick and broad for the the clips to stay in place.  At this time I used a standard GIA staple load through the epigastric port site to transect the cystic duct, staying toward the gallbladder as to not narrow the common bile duct.  The common bile duct was protected. The gallbladder was then dissected from the liver bed with electrocautery from a combination of top down as well as lateral and medial approach as the gallbladder was very intrahepatic and the liver was flopping over the gallbladder for portions.  The specimen was placed in an Endopouch and was retrieved through the umbilical site after this site was extended further superiorly.  The umbilical site was then closed with 0 Vicryl sutures in the interrupted fashion, and one suture was left untied to allow for the Hasson trocar to be placed back into the abdomen.     Final inspection revealed acceptable hemostasis. [facility]ista and Surgical Jamelle HaringSnow was placed in the gallbladder bed. Given the extent of the dissection and the need to use the stapler to transect the cystic duct a JP drain was placed and pulled out of the most lateral right sided port.  The JP was secured with a 3-0 Nylon suture.  The epigastric port site was too deep and small to closed with a 0 Vicryl fascial suture. The remaining trocars were removed and pneumoperitoneum was released. Skin incisions were closed with 4-0 Monocryl subcuticular sutures and Dermabond. The patient was awakened from anesthesia and extubated without complication.    Algis GreenhouseLindsay Bridges, MD [facility] [address] [place], [place]  [phone] (office)

## 2018-06-29 ENCOUNTER — Encounter (HOSPITAL_COMMUNITY): Payer: Self-pay | Admitting: General Surgery

## 2018-06-29 ENCOUNTER — Ambulatory Visit: Payer: Self-pay | Admitting: General Surgery

## 2018-06-29 MED ORDER — METOPROLOL TARTRATE 25 MG PO TABS
25.0000 mg | ORAL_TABLET | Freq: Two times a day (BID) | ORAL | Status: DC
  Administered 2018-06-29 (×2): 25 mg via ORAL
  Filled 2018-06-29 (×2): qty 1

## 2018-06-29 MED ORDER — ACETAMINOPHEN 325 MG PO TABS
650.0000 mg | ORAL_TABLET | Freq: Four times a day (QID) | ORAL | Status: DC | PRN
  Administered 2018-06-29 – 2018-06-30 (×2): 650 mg via ORAL
  Filled 2018-06-29 (×2): qty 2

## 2018-06-29 MED ORDER — HYDRALAZINE HCL 20 MG/ML IJ SOLN
15.0000 mg | Freq: Once | INTRAMUSCULAR | Status: AC
  Administered 2018-06-29: 15 mg via INTRAVENOUS
  Filled 2018-06-29: qty 1

## 2018-06-29 NOTE — Progress Notes (Signed)
[facility]  Patient seen this afternoon. Using restroom. Has been ambulating. Fever down. Pain improved. Will plan to d/c tomorrow AM. If has additional fevers or worsening clinically can add labs for tomorrow. Plan for d/c drain tomorrow as long as remains SS.   Discussed with Dr. Laural BenesJohnson.

## 2018-06-29 NOTE — Anesthesia Postprocedure Evaluation (Signed)
Anesthesia Post Note  Patient: Leah Palmer  Procedure(s) Performed: LAPAROSCOPIC CHOLECYSTECTOMY (N/A )  Patient location during evaluation: Nursing Unit Anesthesia Type: General Level of consciousness: awake and patient cooperative Pain management: pain level controlled Vital Signs Assessment: post-procedure vital signs reviewed and stable Respiratory status: spontaneous breathing, nonlabored ventilation and respiratory function stable Cardiovascular status: blood pressure returned to baseline Postop Assessment: no apparent nausea or vomiting Anesthetic complications: no     Last Vitals:  Vitals:   06/29/18 0550 06/29/18 0702  BP: (!) 171/116 (!) 163/112  Pulse: (!) 106 (!) 108  Resp:    Temp:  (!) 38.5 C  SpO2:  96%    Last Pain:  Vitals:   06/29/18 0702  TempSrc: Oral  PainSc:                  Camelia Stelzner J

## 2018-06-29 NOTE — Progress Notes (Signed)
[facility] Progress Note  1 Day Post-Op  Subjective: Fevers this AM. Slept through the night per report and took in some pain meds as needed. HTN this AM too. Says she is sore but no nausea.or vomiting. Took in some po.   Objective: Vital signs in last 24 hours: Temp:  [98.7 F (37.1 C)-101.4 F (38.6 C)] 101.4 F (38.6 C) (08/06 0830) Pulse Rate:  [92-115] 115 (08/06 0830) Resp:  [14-43] 18 (08/06 0830) BP: (141-180)/(89-122) 146/91 (08/06 0830) SpO2:  [96 %-100 %] 97 % (08/06 0830) Weight:  [251 lb (113.9 kg)] 251 lb (113.9 kg) (08/05 1110) Last BM Date: 06/27/18  Intake/Output from previous day: 08/05 0701 - 08/06 0700 In: 1600 [I.V.:1600] Out: 240 [Drains:190; Blood:50] Intake/Output this shift: Total I/O In: -  Out: 40 [Drains:40]  General appearance: alert, cooperative and no distress Resp: normal work breathing GI: soft, nondistended, tender around port site and umbilical extraction site, JP with SS output, stripped, no rebound or guarding, nontenderin the left lower and right lower quadrants  Lab Results:  Recent Labs    06/27/18 1238 06/28/18 0603  WBC 6.8 9.7  HGB 11.2* 10.8*  HCT 35.2* 33.9*  PLT 337 337   BMET Recent Labs    06/27/18 1238 06/28/18 0603  NA 140 138  K 2.9* 3.0*  CL 99 101  CO2 34* 31  GLUCOSE 109* 96  BUN 8 6  CREATININE 0.73 0.70  CALCIUM 9.2 8.2*   PT/INR No results for input(s): LABPROT, INR in the last 72 hours.  Studies/Results: Ct Abdomen Pelvis W Contrast  Result Date: 06/27/2018 CLINICAL DATA:  Upper abdominal pain beginning this morning, nausea and diarrhea. History of biliary colic and hypertension. EXAM: CT ABDOMEN AND PELVIS WITH CONTRAST TECHNIQUE: Multidetector CT imaging of the abdomen and pelvis was performed using the standard protocol following bolus administration of intravenous contrast. CONTRAST:  ISOVUE-300 IOPAMIDOL (ISOVUE-300) INJECTION 61% COMPARISON:  Pelvic ultrasound June 19, 2018 and abdominal ultrasound June 19, 2018. FINDINGS: LOWER CHEST: Lung bases are clear. Included heart size is normal. No pericardial effusion. HEPATOBILIARY: Mild gallbladder distention and pericholecystic fluid. No CT findings of cholelithiasis though ultrasound is more sensitive. Minimal intrahepatic biliary dilatation. Focal fatty infiltration about the falciform ligament. PANCREAS: Normal. SPLEEN: 14 mm homogeneously hypodense cyst or lymphangioma in the spleen. ADRENALS/URINARY TRACT: Kidneys are orthotopic, demonstrating symmetric enhancement. No nephrolithiasis, hydronephrosis or solid renal masses. The unopacified ureters are normal in course and caliber. Urinary bladder is adequately distended and unremarkable. Normal adrenal glands. STOMACH/BOWEL: The stomach, small and large bowel are normal in course and caliber without inflammatory changes. Small and large bowel air-fluid levels. Fluid distended 10 mm appendix without wall thickening or periappendiceal inflammation. VASCULAR/LYMPHATIC: Aortoiliac vessels are normal in course and caliber. No lymphadenopathy by CT size criteria. REPRODUCTIVE: 3 cm homogeneously hypodense benign-appearing RIGHT adnexal cyst. Air density in the vagina consistent with a tampon. OTHER: No intraperitoneal free fluid or free air. MUSCULOSKELETAL: Nonacute. IMPRESSION: 1. Pericholecystic fluid and mild intrahepatic biliary dilatation concerning for acute cholecystitis. 2. Small and large bowel air-fluid levels seen with enteritis. 3. Enlarged 10 mm appendix without corroborative findings of acute appendicitis. This may be related to patient's enteritis. Electronically Signed   By: Awilda Metro M.D.   On: 06/27/2018 18:11    Anti-infectives: Anti-infectives (From admission, onward)   Start     Dose/Rate Route Frequency Ordered Stop   06/28/18 1115  cefoTEtan (CEFOTAN) 2 g in sodium chloride 0.9 %  100 mL IVPB     2 g 200 mL/hr over 30 Minutes Intravenous On  call to O.R. 06/28/18 1113 06/28/18 1900   06/27/18 1830  cefTRIAXone (ROCEPHIN) 2 g in sodium chloride 0.9 % 100 mL IVPB     2 g 200 mL/hr over 30 Minutes Intravenous  Once 06/27/18 1828 06/27/18 1908      Assessment/Plan: [name] is a 27 yo POD 1 s/p Lap chole for cholecystitis. Having some fever this AM to >101. Has not been doing IS overnight as has been sleeping. Was not up moving around yet either. Gallbladder with severely inflamed and could be causing this stress response versus atelectasis driving the fever. -IS ordered, needs to ambulate, told patient and family -Diet as tolerated -Tylenol ordered by Dr. Laural BenesJohnson -Will monitor -JP to be removed prior to discharge, will keep for now, SS output    LOS: 0 days    Leah Palmer 06/29/2018

## 2018-06-29 NOTE — Progress Notes (Signed)
PROGRESS NOTE  Leah Palmer  ZOX:[id number]  DOB: 04/15/1991  DOA: 06/27/2018 PCP: Patient, No Pcp Per  Brief Admission Hx: Leah Palmer is a 27 y.o. female with medical history significant of obesity, hypertension, biliary colic comes in with persistent right upper quadrant abdominal pain.    MDM/Assessment & Plan:   1. Acute cholecystitis - Pt is POD#1 s/p lap chole, working on ambulating today and using incentive spirometry.  Pain is controlled.  Pt tolerating diet.  2. Postop fever - tylenol ordered as needed.  Start ambulation, incentive spirometry.  Follow.  I spoke with Dr. Henreitta LeberBridges about plan of care.    3. Essential hypertension - blood pressures are suboptimal, improved with IV labetalol.  Adding metoprolol 25 mg BID.  Continue lisinopril 20 mg daily.   4. Hypokalemia - repleted orally.     DVT prophylaxis: SCDs Code Status: Full  Family Communication: patient  Disposition Plan: home when medically/surgically stable  Consultants:  surgery  Subjective: Pt says that she has pain only with eating and pressure on the abdomen.    Objective: Vitals:   06/29/18 0702 06/29/18 0759 06/29/18 0804 06/29/18 0830  BP: (!) 163/112 (!) 160/115  (!) 146/91  Pulse: (!) 108   (!) 115  Resp:    18  Temp: (!) 101.3 F (38.5 C)  (!) 101.1 F (38.4 C) (!) 101.4 F (38.6 C)  TempSrc: Oral  Oral Oral  SpO2: 96%   97%  Weight:      Height:        Intake/Output Summary (Last 24 hours) at 06/29/2018 0849 Last data filed at 06/29/2018 0805 Gross per 24 hour  Intake 1600 ml  Output 280 ml  Net 1320 ml   Filed Weights   06/27/18 1204 06/27/18 2006 06/28/18 1110  Weight: 110.7 kg (244 lb) 114.3 kg (251 lb 15.8 oz) 113.9 kg (251 lb)   REVIEW OF SYSTEMS  As per history otherwise all reviewed and reported negative  Exam:  General exam: awake, alert, cooperative, NAD.  Respiratory system: Clear. No increased work of breathing. Cardiovascular system: S1 & S2 heard, RRR. No JVD, murmurs,  gallops, clicks or pedal edema. Gastrointestinal system: Abdomen is nondistended, soft and with mild tenderness.  bowel sounds heard. Wounds clean and dry. Drain in place.  Central nervous system: Alert and oriented. No focal neurological deficits. Extremities: no CCE.  Data Reviewed: Basic Metabolic Panel: Recent Labs  Lab 06/27/18 1238 06/28/18 0603  NA 140 138  K 2.9* 3.0*  CL 99 101  CO2 34* 31  GLUCOSE 109* 96  BUN 8 6  CREATININE 0.73 0.70  CALCIUM 9.2 8.2*  MG 1.9 1.8   Liver Function Tests: Recent Labs  Lab 06/27/18 1238 06/28/18 0603  AST 16 41  ALT 17 42  ALKPHOS 75 81  BILITOT 0.4 0.5  PROT 7.4 6.9  ALBUMIN 3.6 3.3*   Recent Labs  Lab 06/27/18 1238  LIPASE 36   No results for input(s): AMMONIA in the last 168 hours. CBC: Recent Labs  Lab 06/27/18 1238 06/28/18 0603  WBC 6.8 9.7  HGB 11.2* 10.8*  HCT 35.2* 33.9*  MCV 86.1 85.8  PLT 337 337   Cardiac Enzymes: No results for input(s): CKTOTAL, CKMB, CKMBINDEX, TROPONINI in the last 168 hours. CBG (last 3)  No results for input(s): GLUCAP in the last 72 hours. Recent Results (from the past 240 hour(s))  Surgical PCR screen     Status: None   Collection Time: 06/27/18  8:19 PM  Result Value Ref Range Status   MRSA, PCR NEGATIVE NEGATIVE Final   Staphylococcus aureus NEGATIVE NEGATIVE Final    Comment: (NOTE) The Xpert SA Assay (FDA approved for NASAL specimens in patients 66 years of age and older), is one component of a comprehensive surveillance program. It is not intended to diagnose infection nor to guide or monitor treatment. Performed at [facility], [address], [place], [place] [address]      Studies: Ct Abdomen Pelvis W Contrast  Result Date: 06/27/2018 CLINICAL DATA:  Upper abdominal pain beginning this morning, nausea and diarrhea. History of biliary colic and hypertension. EXAM: CT ABDOMEN AND PELVIS WITH CONTRAST TECHNIQUE: Multidetector CT imaging of the abdomen and pelvis  was performed using the standard protocol following bolus administration of intravenous contrast. CONTRAST:  ISOVUE-300 IOPAMIDOL (ISOVUE-300) INJECTION 61% COMPARISON:  Pelvic ultrasound June 19, 2018 and abdominal ultrasound June 19, 2018. FINDINGS: LOWER CHEST: Lung bases are clear. Included heart size is normal. No pericardial effusion. HEPATOBILIARY: Mild gallbladder distention and pericholecystic fluid. No CT findings of cholelithiasis though ultrasound is more sensitive. Minimal intrahepatic biliary dilatation. Focal fatty infiltration about the falciform ligament. PANCREAS: Normal. SPLEEN: 14 mm homogeneously hypodense cyst or lymphangioma in the spleen. ADRENALS/URINARY TRACT: Kidneys are orthotopic, demonstrating symmetric enhancement. No nephrolithiasis, hydronephrosis or solid renal masses. The unopacified ureters are normal in course and caliber. Urinary bladder is adequately distended and unremarkable. Normal adrenal glands. STOMACH/BOWEL: The stomach, small and large bowel are normal in course and caliber without inflammatory changes. Small and large bowel air-fluid levels. Fluid distended 10 mm appendix without wall thickening or periappendiceal inflammation. VASCULAR/LYMPHATIC: Aortoiliac vessels are normal in course and caliber. No lymphadenopathy by CT size criteria. REPRODUCTIVE: 3 cm homogeneously hypodense benign-appearing RIGHT adnexal cyst. Air density in the vagina consistent with a tampon. OTHER: No intraperitoneal free fluid or free air. MUSCULOSKELETAL: Nonacute. IMPRESSION: 1. Pericholecystic fluid and mild intrahepatic biliary dilatation concerning for acute cholecystitis. 2. Small and large bowel air-fluid levels seen with enteritis. 3. Enlarged 10 mm appendix without corroborative findings of acute appendicitis. This may be related to patient's enteritis. Electronically Signed   By: Awilda Metro M.D.   On: 06/27/2018 18:11   Scheduled Meds: . lisinopril  20 mg Oral Daily    Continuous Infusions:  Principal Problem:   Acute cholecystitis Active Problems:   Hypertension  Time spent:   Standley Dakins, MD, FAAFP [facility] Pager [phone]  If 7PM-7AM, please contact night-coverage [email or web address] Password [email or web address] 06/29/2018, 8:49 AM    LOS: 0 days

## 2018-06-29 NOTE — Progress Notes (Signed)
Dr Laural BenesJohnson notified of most recent temperature and blood pressure.

## 2018-06-29 NOTE — Addendum Note (Signed)
Addendum  created 06/29/18 0749 by Despina HiddenIdacavage, Adelaido Nicklaus J, CRNA   Sign clinical note

## 2018-06-30 ENCOUNTER — Ambulatory Visit: Payer: Self-pay | Admitting: Physician Assistant

## 2018-06-30 DIAGNOSIS — Z91148 Patient's other noncompliance with medication regimen for other reason: Secondary | ICD-10-CM

## 2018-06-30 DIAGNOSIS — Z9114 Patient's other noncompliance with medication regimen: Secondary | ICD-10-CM

## 2018-06-30 DIAGNOSIS — E876 Hypokalemia: Secondary | ICD-10-CM

## 2018-06-30 MED ORDER — LISINOPRIL 10 MG PO TABS
40.0000 mg | ORAL_TABLET | Freq: Every day | ORAL | Status: DC
  Administered 2018-06-30: 40 mg via ORAL
  Filled 2018-06-30: qty 4

## 2018-06-30 MED ORDER — OXYCODONE HCL 5 MG PO TABS: 5.0000 mg | ORAL_TABLET | Freq: Four times a day (QID) | ORAL | 0 refills | Status: AC | PRN

## 2018-06-30 MED ORDER — METOPROLOL TARTRATE 50 MG PO TABS: 50.0000 mg | ORAL_TABLET | Freq: Two times a day (BID) | ORAL | 0 refills | Status: DC

## 2018-06-30 MED ORDER — HYDROCHLOROTHIAZIDE 12.5 MG PO CAPS
12.5000 mg | ORAL_CAPSULE | Freq: Every day | ORAL | Status: DC
  Administered 2018-06-30: 12.5 mg via ORAL
  Filled 2018-06-30: qty 1

## 2018-06-30 MED ORDER — HYDRALAZINE HCL 25 MG PO TABS
25.0000 mg | ORAL_TABLET | Freq: Three times a day (TID) | ORAL | Status: DC
  Administered 2018-06-30 (×2): 25 mg via ORAL
  Filled 2018-06-30 (×2): qty 1

## 2018-06-30 MED ORDER — HYDRALAZINE HCL 25 MG PO TABS: 50.0000 mg | ORAL_TABLET | Freq: Three times a day (TID) | ORAL | Status: DC

## 2018-06-30 MED ORDER — AMLODIPINE BESYLATE 5 MG PO TABS
5.0000 mg | ORAL_TABLET | Freq: Every day | ORAL | Status: DC
  Administered 2018-06-30: 5 mg via ORAL
  Filled 2018-06-30: qty 1

## 2018-06-30 MED ORDER — HYDRALAZINE HCL 25 MG PO TABS
25.0000 mg | ORAL_TABLET | Freq: Once | ORAL | Status: AC
  Administered 2018-06-30: 25 mg via ORAL
  Filled 2018-06-30: qty 1

## 2018-06-30 MED ORDER — HYDRALAZINE HCL 50 MG PO TABS: 50.0000 mg | ORAL_TABLET | Freq: Three times a day (TID) | ORAL | 0 refills | Status: DC

## 2018-06-30 MED ORDER — LISINOPRIL-HYDROCHLOROTHIAZIDE 20-12.5 MG PO TABS: 1.0000 | ORAL_TABLET | Freq: Every day | ORAL | 0 refills | Status: DC

## 2018-06-30 MED ORDER — LISINOPRIL 10 MG PO TABS: 30.0000 mg | ORAL_TABLET | Freq: Every day | ORAL | Status: DC

## 2018-06-30 MED ORDER — METOPROLOL TARTRATE 25 MG PO TABS: 75.0000 mg | ORAL_TABLET | Freq: Two times a day (BID) | ORAL | Status: DC

## 2018-06-30 MED ORDER — POTASSIUM CHLORIDE ER 10 MEQ PO TBCR: 10.0000 meq | EXTENDED_RELEASE_TABLET | Freq: Every day | ORAL | 0 refills | Status: AC

## 2018-06-30 MED ORDER — METOPROLOL TARTRATE 50 MG PO TABS
50.0000 mg | ORAL_TABLET | Freq: Two times a day (BID) | ORAL | Status: DC
  Administered 2018-06-30: 50 mg via ORAL
  Filled 2018-06-30: qty 1

## 2018-06-30 MED ORDER — LABETALOL HCL 5 MG/ML IV SOLN
10.0000 mg | Freq: Once | INTRAVENOUS | Status: AC
  Administered 2018-06-30: 10 mg via INTRAVENOUS
  Filled 2018-06-30: qty 4

## 2018-06-30 NOTE — Progress Notes (Signed)
Discharge instructions read to patient by B Stone RN. All questions answered. Discharged with family to home

## 2018-06-30 NOTE — Progress Notes (Signed)
JP drain removed per orders

## 2018-06-30 NOTE — Discharge Summary (Signed)
Physician Discharge Summary  Leah Palmer [id number] DOB: 03/23/1991 DOA: 06/27/2018  PCP: [facility] Surgery: Leah Palmer  Admit date: 06/27/2018 Discharge date: 06/30/2018  Admitted From: Home  Disposition: Home   Recommendations for Outpatient Follow-up:  1. Follow up with PCP in 1 weeks 2. Follow up with Dr. Henreitta Palmer in 2 weeks 3. Please obtain BMP/CBC in one week  Discharge Condition: STABLE   CODE STATUS: FULL    Brief Hospitalization Summary: Please see all hospital notes, images, labs for full details of the hospitalization.  HPI: Leah Palmer is a 27 y.o. female with medical history significant of obesity, hypertension, biliary colic comes in with persistent right upper quadrant abdominal pain that is not gone away in several days.  She denies any fevers.  She has occasional vomiting.  No diarrhea.  She is post follow-up with Dr. Donell Palmer in 2 days for surgical evaluation for gallbladder removal.  However she is much more symptomatic now and cannot wait for outpatient evaluation.    MDM/Assessment & Plan:   1. Acute cholecystitis - Pt is POD#2 s/p lap chole, patient ambulating and using incentive spirometry.  Pain is controlled.  Pt tolerating diet. Discharge home follow up with surgery in 2 weeks.   Pt was advised to seek medical care if she had any symptoms of complications and instructions written in discharge AVS instruction sheet.  2. Postop fever -improving with tylenol, ambulation, recovery.   DC JP drain prior to discharge.  3. Uncontrolled hypertension - blood pressures are suboptimally controlled,  Increased metoprolol to 50 mg BID. Discharge on zestoretic 20/12.5 daily, hydralazine 50 mg TID and metoprolol 50 mg BID, further titration and adjustment outpatient with primary care provider.    Asked care manager to assist with medications and primary care follow up.  [facility]  4. Hypokalemia - repleted orally.     DVT prophylaxis: SCDs Code  Status: Full  Family Communication: patient  Disposition Plan: home  Consultants:  surgery  Discharge Diagnoses:  Principal Problem:   Acute cholecystitis Active Problems:   Hypertension   Noncompliance with medication regimen  Discharge Instructions: Discharge Instructions    Call MD for:  difficulty breathing, headache or visual disturbances   Complete by:  As directed    Call MD for:  extreme fatigue   Complete by:  As directed    Call MD for:  hives   Complete by:  As directed    Call MD for:  persistant dizziness or light-headedness   Complete by:  As directed    Call MD for:  persistant nausea and vomiting   Complete by:  As directed    Call MD for:  redness, tenderness, or signs of infection (pain, swelling, redness, odor or green/yellow discharge around incision site)   Complete by:  As directed    Call MD for:  severe uncontrolled pain   Complete by:  As directed    Increase activity slowly   Complete by:  As directed      Allergies as of 06/30/2018   No Known Allergies     Medication List    STOP taking these medications   HYDROcodone-acetaminophen 5-325 MG tablet Commonly known as:  NORCO/VICODIN   lisinopril-hydrochlorothiazide 20-25 MG tablet Commonly known as:  PRINZIDE,ZESTORETIC Replaced by:  lisinopril-hydrochlorothiazide 20-12.5 MG tablet   ondansetron 4 MG disintegrating tablet Commonly known as:  ZOFRAN ODT     TAKE these medications   hydrALAZINE 50 MG tablet Commonly  known as:  APRESOLINE Take 1 tablet (50 mg total) by mouth every 8 (eight) hours.   lisinopril-hydrochlorothiazide 20-12.5 MG tablet Commonly known as:  PRINZIDE,ZESTORETIC Take 1 tablet by mouth daily. Replaces:  lisinopril-hydrochlorothiazide 20-25 MG tablet   metoprolol tartrate 50 MG tablet Commonly known as:  LOPRESSOR Take 1 tablet (50 mg total) by mouth 2 (two) times daily.   oxyCODONE 5 MG immediate release tablet Commonly known as:  Oxy IR/ROXICODONE Take  1 tablet (5 mg total) by mouth every 6 (six) hours as needed for up to 3 days for severe pain.   potassium chloride 10 MEQ tablet Commonly known as:  K-DUR Take 1 tablet (10 mEq total) by mouth daily.      Follow-up Information    Leah Roers, MD. Schedule an appointment as soon as possible for a visit in 2 week(s).   Specialty:  General Surgery Why:  Hospital Follow Up  Contact information: [address] [place] [facility] [address] [phone]        Health, [facility]. Schedule an appointment as soon as possible for a visit in 1 week(s).   Why:  Hospital Follow Up and check blood pressure Contact information: [address] [place] [address] [phone]          No Known Allergies Allergies as of 06/30/2018   No Known Allergies     Medication List    STOP taking these medications   HYDROcodone-acetaminophen 5-325 MG tablet Commonly known as:  NORCO/VICODIN   lisinopril-hydrochlorothiazide 20-25 MG tablet Commonly known as:  PRINZIDE,ZESTORETIC Replaced by:  lisinopril-hydrochlorothiazide 20-12.5 MG tablet   ondansetron 4 MG disintegrating tablet Commonly known as:  ZOFRAN ODT     TAKE these medications   hydrALAZINE 50 MG tablet Commonly known as:  APRESOLINE Take 1 tablet (50 mg total) by mouth every 8 (eight) hours.   lisinopril-hydrochlorothiazide 20-12.5 MG tablet Commonly known as:  PRINZIDE,ZESTORETIC Take 1 tablet by mouth daily. Replaces:  lisinopril-hydrochlorothiazide 20-25 MG tablet   metoprolol tartrate 50 MG tablet Commonly known as:  LOPRESSOR Take 1 tablet (50 mg total) by mouth 2 (two) times daily.   oxyCODONE 5 MG immediate release tablet Commonly known as:  Oxy IR/ROXICODONE Take 1 tablet (5 mg total) by mouth every 6 (six) hours as needed for up to 3 days for severe pain.   potassium chloride 10 MEQ tablet Commonly known as:  K-DUR Take 1 tablet (10 mEq total) by mouth daily.        Procedures/Studies: US Pelvis Transvanginal Non-ob (tv Only)  Result Date: 06/19/2018 CLINICAL DATA:  Pelvic pain for several days EXAM: TRANSABDOMINAL AND TRANSVAGINAL ULTRASOUND OF PELVIS DOPPLER ULTRASOUND OF OVARIES TECHNIQUE: Both transabdominal and transvaginal ultrasound examinations of the pelvis were performed. Transabdominal technique was performed for global imaging of the pelvis including uterus, ovaries, adnexal regions, and pelvic cul-de-sac. It was necessary to proceed with endovaginal exam following the transabdominal exam to visualize the ovaries. Color and duplex Doppler ultrasound was utilized to evaluate blood flow to the ovaries. COMPARISON:  None. FINDINGS: Uterus Measurements: 8.2 x 2.5 x 4.7 cm. No fibroids or other mass visualized. Endometrium Thickness: 8 mm.  No focal abnormality visualized. Right ovary Measurements: 4.1 x 2.0 x 2.7 cm. Normal appearance/no adnexal mass. Left ovary Measurements: 3.9 x 2.4 x 2.4 cm. Normal appearance/no adnexal mass. Pulsed Doppler evaluation of both ovaries demonstrates normal low-resistance arterial and venous waveforms. Other findings Minimal free pelvic fluid is noted likely related to the patient's  current menstrual status. IMPRESSION: Unremarkable pelvic ultrasound. Minimal free fluid is noted likely physiologic in nature. No ovarian abnormality is noted. Electronically Signed   By: Alcide Clever M.D.   On: 06/19/2018 11:17   US Pelvis (transabdominal Only)  Result Date: 06/19/2018 CLINICAL DATA:  Pelvic pain for several days EXAM: TRANSABDOMINAL AND TRANSVAGINAL ULTRASOUND OF PELVIS DOPPLER ULTRASOUND OF OVARIES TECHNIQUE: Both transabdominal and transvaginal ultrasound examinations of the pelvis were performed. Transabdominal technique was performed for global imaging of the pelvis including uterus, ovaries, adnexal regions, and pelvic cul-de-sac. It was necessary to proceed with endovaginal exam following the transabdominal exam to  visualize the ovaries. Color and duplex Doppler ultrasound was utilized to evaluate blood flow to the ovaries. COMPARISON:  None. FINDINGS: Uterus Measurements: 8.2 x 2.5 x 4.7 cm. No fibroids or other mass visualized. Endometrium Thickness: 8 mm.  No focal abnormality visualized. Right ovary Measurements: 4.1 x 2.0 x 2.7 cm. Normal appearance/no adnexal mass. Left ovary Measurements: 3.9 x 2.4 x 2.4 cm. Normal appearance/no adnexal mass. Pulsed Doppler evaluation of both ovaries demonstrates normal low-resistance arterial and venous waveforms. Other findings Minimal free pelvic fluid is noted likely related to the patient's current menstrual status. IMPRESSION: Unremarkable pelvic ultrasound. Minimal free fluid is noted likely physiologic in nature. No ovarian abnormality is noted. Electronically Signed   By: Alcide Clever M.D.   On: 06/19/2018 11:17   Ct Abdomen Pelvis W Contrast  Result Date: 06/27/2018 CLINICAL DATA:  Upper abdominal pain beginning this morning, nausea and diarrhea. History of biliary colic and hypertension. EXAM: CT ABDOMEN AND PELVIS WITH CONTRAST TECHNIQUE: Multidetector CT imaging of the abdomen and pelvis was performed using the standard protocol following bolus administration of intravenous contrast. CONTRAST:  ISOVUE-300 IOPAMIDOL (ISOVUE-300) INJECTION 61% COMPARISON:  Pelvic ultrasound June 19, 2018 and abdominal ultrasound June 19, 2018. FINDINGS: LOWER CHEST: Lung bases are clear. Included heart size is normal. No pericardial effusion. HEPATOBILIARY: Mild gallbladder distention and pericholecystic fluid. No CT findings of cholelithiasis though ultrasound is more sensitive. Minimal intrahepatic biliary dilatation. Focal fatty infiltration about the falciform ligament. PANCREAS: Normal. SPLEEN: 14 mm homogeneously hypodense cyst or lymphangioma in the spleen. ADRENALS/URINARY TRACT: Kidneys are orthotopic, demonstrating symmetric enhancement. No nephrolithiasis, hydronephrosis  or solid renal masses. The unopacified ureters are normal in course and caliber. Urinary bladder is adequately distended and unremarkable. Normal adrenal glands. STOMACH/BOWEL: The stomach, small and large bowel are normal in course and caliber without inflammatory changes. Small and large bowel air-fluid levels. Fluid distended 10 mm appendix without wall thickening or periappendiceal inflammation. VASCULAR/LYMPHATIC: Aortoiliac vessels are normal in course and caliber. No lymphadenopathy by CT size criteria. REPRODUCTIVE: 3 cm homogeneously hypodense benign-appearing RIGHT adnexal cyst. Air density in the vagina consistent with a tampon. OTHER: No intraperitoneal free fluid or free air. MUSCULOSKELETAL: Nonacute. IMPRESSION: 1. Pericholecystic fluid and mild intrahepatic biliary dilatation concerning for acute cholecystitis. 2. Small and large bowel air-fluid levels seen with enteritis. 3. Enlarged 10 mm appendix without corroborative findings of acute appendicitis. This may be related to patient's enteritis. Electronically Signed   By: Awilda Metro M.D.   On: 06/27/2018 18:11   US Pelvic Doppler (torsion R/o Or Mass Arterial Flow)  Result Date: 06/19/2018 CLINICAL DATA:  Pelvic pain for several days EXAM: TRANSABDOMINAL AND TRANSVAGINAL ULTRASOUND OF PELVIS DOPPLER ULTRASOUND OF OVARIES TECHNIQUE: Both transabdominal and transvaginal ultrasound examinations of the pelvis were performed. Transabdominal technique was performed for global imaging of the pelvis including uterus, ovaries, adnexal regions,  and pelvic cul-de-sac. It was necessary to proceed with endovaginal exam following the transabdominal exam to visualize the ovaries. Color and duplex Doppler ultrasound was utilized to evaluate blood flow to the ovaries. COMPARISON:  None. FINDINGS: Uterus Measurements: 8.2 x 2.5 x 4.7 cm. No fibroids or other mass visualized. Endometrium Thickness: 8 mm.  No focal abnormality visualized. Right ovary  Measurements: 4.1 x 2.0 x 2.7 cm. Normal appearance/no adnexal mass. Left ovary Measurements: 3.9 x 2.4 x 2.4 cm. Normal appearance/no adnexal mass. Pulsed Doppler evaluation of both ovaries demonstrates normal low-resistance arterial and venous waveforms. Other findings Minimal free pelvic fluid is noted likely related to the patient's current menstrual status. IMPRESSION: Unremarkable pelvic ultrasound. Minimal free fluid is noted likely physiologic in nature. No ovarian abnormality is noted. Electronically Signed   By: Alcide Clever M.D.   On: 06/19/2018 11:17   US Abdomen Limited Ruq  Result Date: 06/19/2018 CLINICAL DATA:  Right upper quadrant pain for 1 day EXAM: ULTRASOUND ABDOMEN LIMITED RIGHT UPPER QUADRANT COMPARISON:  02/15/2018 FINDINGS: Gallbladder: Cholelithiasis is again identified. No wall thickening or pericholecystic fluid is noted. Common bile duct: Diameter: 3.5 mm. Liver: No focal lesion identified. Within normal limits in parenchymal echogenicity. Portal vein is patent on color Doppler imaging with normal direction of blood flow towards the liver. IMPRESSION: Cholelithiasis without complicating factors. Electronically Signed   By: Alcide Clever M.D.   On: 06/19/2018 11:44     Subjective: Pt denies pain or discomfort, tolerating diet.  No headaches.  No palpitations. No emesis or nausea.   Discharge Exam: Vitals:   06/30/18 0857 06/30/18 1436  BP: (!) 174/118 (!) 177/107  Pulse: (!) 108 94  Resp:    Temp:  98.8 F (37.1 C)  SpO2:  100%   Vitals:   06/30/18 0132 06/30/18 0557 06/30/18 0857 06/30/18 1436  BP: (!) 186/109 (!) 168/108 (!) 174/118 (!) 177/107  Pulse: 95 (!) 106 (!) 108 94  Resp: 18 17    Temp: 99.1 F (37.3 C) (!) 100.8 F (38.2 C)  98.8 F (37.1 C)  TempSrc: Oral Oral  Oral  SpO2:  100%  100%  Weight:      Height:       General exam: awake, alert, cooperative, NAD.  Respiratory system: Clear. No increased work of breathing. Cardiovascular system: S1  & S2 heard, RRR. No JVD, murmurs, gallops, clicks or pedal edema. Gastrointestinal system: Abdomen is nondistended, soft and with mild tenderness.  bowel sounds heard. Wounds clean and dry. Drain in place.  Central nervous system: Alert and oriented. No focal neurological deficits. Extremities: no CCE.   The results of significant diagnostics from this hospitalization (including imaging, microbiology, ancillary and laboratory) are listed below for reference.     Microbiology: Recent Results (from the past 240 hour(s))  Surgical PCR screen     Status: None   Collection Time: 06/27/18  8:19 PM  Result Value Ref Range Status   MRSA, PCR NEGATIVE NEGATIVE Final   Staphylococcus aureus NEGATIVE NEGATIVE Final    Comment: (NOTE) The Xpert SA Assay (FDA approved for NASAL specimens in patients 50 years of age and older), is one component of a comprehensive surveillance program. It is not intended to diagnose infection nor to guide or monitor treatment. Performed at [facility], [address] [place], [place] [address]      Labs: BNP (last 3 results) No results for input(s): BNP in the last 8760 hours. Basic Metabolic Panel: Recent Labs  Lab 06/27/18 1238 06/28/18 0603  NA 140 138  K 2.9* 3.0*  CL 99 101  CO2 34* 31  GLUCOSE 109* 96  BUN 8 6  CREATININE 0.73 0.70  CALCIUM 9.2 8.2*  MG 1.9 1.8   Liver Function Tests: Recent Labs  Lab 06/27/18 1238 06/28/18 0603  AST 16 41  ALT 17 42  ALKPHOS 75 81  BILITOT 0.4 0.5  PROT 7.4 6.9  ALBUMIN 3.6 3.3*   Recent Labs  Lab 06/27/18 1238  LIPASE 36   No results for input(s): AMMONIA in the last 168 hours. CBC: Recent Labs  Lab 06/27/18 1238 06/28/18 0603  WBC 6.8 9.7  HGB 11.2* 10.8*  HCT 35.2* 33.9*  MCV 86.1 85.8  PLT 337 337   Cardiac Enzymes: No results for input(s): CKTOTAL, CKMB, CKMBINDEX, TROPONINI in the last 168 hours. BNP: Invalid input(s): POCBNP CBG: No results for input(s): GLUCAP in the last  168 hours. D-Dimer No results for input(s): DDIMER in the last 72 hours. Hgb A1c No results for input(s): HGBA1C in the last 72 hours. Lipid Profile No results for input(s): CHOL, HDL, LDLCALC, TRIG, CHOLHDL, LDLDIRECT in the last 72 hours. Thyroid function studies No results for input(s): TSH, T4TOTAL, T3FREE, THYROIDAB in the last 72 hours.  Invalid input(s): FREET3 Anemia work up No results for input(s): VITAMINB12, FOLATE, FERRITIN, TIBC, IRON, RETICCTPCT in the last 72 hours. Urinalysis    Component Value Date/Time   COLORURINE STRAW (A) 06/27/2018 1630   APPEARANCEUR CLEAR 06/27/2018 1630   LABSPEC 1.009 06/27/2018 1630   PHURINE 8.0 06/27/2018 1630   GLUCOSEU NEGATIVE 06/27/2018 1630   HGBUR MODERATE (A) 06/27/2018 1630   BILIRUBINUR NEGATIVE 06/27/2018 1630   KETONESUR NEGATIVE 06/27/2018 1630   PROTEINUR NEGATIVE 06/27/2018 1630   NITRITE NEGATIVE 06/27/2018 1630   LEUKOCYTESUR NEGATIVE 06/27/2018 1630   Sepsis Labs Invalid input(s): PROCALCITONIN,  WBC,  LACTICIDVEN Microbiology Recent Results (from the past 240 hour(s))  Surgical PCR screen     Status: None   Collection Time: 06/27/18  8:19 PM  Result Value Ref Range Status   MRSA, PCR NEGATIVE NEGATIVE Final   Staphylococcus aureus NEGATIVE NEGATIVE Final    Comment: (NOTE) The Xpert SA Assay (FDA approved for NASAL specimens in patients 42 years of age and older), is one component of a comprehensive surveillance program. It is not intended to diagnose infection nor to guide or monitor treatment. Performed at [facility], [address] [place], [place] [address]    Time coordinating discharge: 34 minutes  SIGNED:  Standley Dakins, MD  [facility] 06/30/2018, 3:23 PM Pager [phone]  If 7PM-7AM, please contact night-coverage [email or web address] Password [email or web address]

## 2018-06-30 NOTE — Progress Notes (Signed)
PROGRESS NOTE  Cady Hafen  ZOX:[id number]  DOB: 01/03/1991  DOA: 06/27/2018 PCP: Patient, No Pcp Per  Brief Admission Hx: Carolena Fairbank is a 27 y.o. female with medical history significant of obesity, hypertension, biliary colic comes in with persistent right upper quadrant abdominal pain.    MDM/Assessment & Plan:   1. Acute cholecystitis - Pt is POD#2 s/p lap chole, patient ambulating and using incentive spirometry.  Pain is controlled.  Pt tolerating diet. Will discuss with surgical team.  2. Postop fever -improving with tylenol, ambulation, recovery.    3. Uncontrolled hypertension - blood pressures are suboptimal,  Increase metoprolol to 50 mg BID.  Increase lisinopril to 40 mg daily and added hydralazine 25 mg TID.  Ask care manager to assist with medications and primary care follow up.  4. Hypokalemia - repleted orally.     DVT prophylaxis: SCDs Code Status: Full  Family Communication: patient  Disposition Plan: home when ok with surgical team  Consultants:  surgery  Subjective: Pt says she is feeling better today.  No BM but has been eating and drinking well.  Pain much better.  Says she is using IS.  No emesis.  Objective: Vitals:   06/29/18 2237 06/30/18 0132 06/30/18 0557 06/30/18 0857  BP: (!) 168/103 (!) 186/109 (!) 168/108 (!) 174/118  Pulse:  95 (!) 106 (!) 108  Resp:  18 17   Temp:  99.1 F (37.3 C) (!) 100.8 F (38.2 C)   TempSrc:  Oral Oral   SpO2:   100%   Weight:      Height:        Intake/Output Summary (Last 24 hours) at 06/30/2018 1140 Last data filed at 06/30/2018 0132 Gross per 24 hour  Intake 240 ml  Output 180 ml  Net 60 ml   Filed Weights   06/27/18 1204 06/27/18 2006 06/28/18 1110  Weight: 110.7 kg (244 lb) 114.3 kg (251 lb 15.8 oz) 113.9 kg (251 lb)   REVIEW OF SYSTEMS  As per history otherwise all reviewed and reported negative  Exam:  General exam: awake, alert, cooperative, NAD.  Respiratory system: Clear. No increased work of  breathing. Cardiovascular system: S1 & S2 heard, RRR. No JVD, murmurs, gallops, clicks or pedal edema. Gastrointestinal system: Abdomen is nondistended, soft and with mild tenderness.  bowel sounds heard. Wounds clean and dry. Drain in place.  Central nervous system: Alert and oriented. No focal neurological deficits. Extremities: no CCE.  Data Reviewed: Basic Metabolic Panel: Recent Labs  Lab 06/27/18 1238 06/28/18 0603  NA 140 138  K 2.9* 3.0*  CL 99 101  CO2 34* 31  GLUCOSE 109* 96  BUN 8 6  CREATININE 0.73 0.70  CALCIUM 9.2 8.2*  MG 1.9 1.8   Liver Function Tests: Recent Labs  Lab 06/27/18 1238 06/28/18 0603  AST 16 41  ALT 17 42  ALKPHOS 75 81  BILITOT 0.4 0.5  PROT 7.4 6.9  ALBUMIN 3.6 3.3*   Recent Labs  Lab 06/27/18 1238  LIPASE 36   No results for input(s): AMMONIA in the last 168 hours. CBC: Recent Labs  Lab 06/27/18 1238 06/28/18 0603  WBC 6.8 9.7  HGB 11.2* 10.8*  HCT 35.2* 33.9*  MCV 86.1 85.8  PLT 337 337   Cardiac Enzymes: No results for input(s): CKTOTAL, CKMB, CKMBINDEX, TROPONINI in the last 168 hours. CBG (last 3)  No results for input(s): GLUCAP in the last 72 hours. Recent Results (from the past 240 hour(s))  Surgical  PCR screen     Status: None   Collection Time: 06/27/18  8:19 PM  Result Value Ref Range Status   MRSA, PCR NEGATIVE NEGATIVE Final   Staphylococcus aureus NEGATIVE NEGATIVE Final    Comment: (NOTE) The Xpert SA Assay (FDA approved for NASAL specimens in patients 26 years of age and older), is one component of a comprehensive surveillance program. It is not intended to diagnose infection nor to guide or monitor treatment. Performed at [facility], [address] [place], [place] [address]      Studies: No results found. Scheduled Meds: . hydrALAZINE  25 mg Oral Q8H  . lisinopril  40 mg Oral Daily  . metoprolol tartrate  50 mg Oral BID   Continuous Infusions:  Principal Problem:   Acute  cholecystitis Active Problems:   Hypertension  Time spent:   Standley Dakinslanford Kinslei Labine, MD, FAAFP [facility] Pager [phone]  If 7PM-7AM, please contact night-coverage [email or web address] Password [email or web address] 06/30/2018, 11:40 AM    LOS: 0 days

## 2018-06-30 NOTE — Care Management (Signed)
S/p lap chole pt from home. Pt has no insurance and no PCP. PT will f/u with surgeon. Pt will be given list of providers accepting new patients and those accepting pt's with no insurance. Pt's medications are on the [facility]4 list at [facility] or a narcotic. No MATCH indicated for med assistance. [facility]

## 2018-06-30 NOTE — Discharge Instructions (Signed)
Discharge Instructions: Shower per your regular routine. Take tylenol and ibuprofen as needed for pain control, alternating every 4-6 hours.  Take Roxicodone for breakthrough pain. Take colace for constipation related to narcotic pain medication. Do not pick at the dermabond glue on your incision sites.  Drain site might drain, dry dressing as needed. Do your incentive spirometer at home.  If you have a fever in the next several days, try to incentive spirometer first, and this should start to bring the fever down.  A low grade fever is common after surgery due to your lungs not fully inflating.  If you are having a fever > 101.5 that persist for more than 2 hours after you do the incentive spirometry multiple times, notify the surgery office and if on the weekend / night go to the ED.    Laparoscopic Cholecystectomy, Care After This sheet gives you information about how to care for yourself after your procedure. Your doctor may also give you more specific instructions. If you have problems or questions, contact your doctor. Follow these instructions at home: Care for cuts from surgery (incisions)   Follow instructions from your doctor about how to take care of your cuts from surgery. Make sure you: ? Wash your hands with soap and water before you change your bandage (dressing). If you cannot use soap and water, use hand sanitizer. ? Change your bandage as told by your doctor. ? Leave stitches (sutures), skin glue, or skin tape (adhesive) strips in place. They may need to stay in place for 2 weeks or longer. If tape strips get loose and curl up, you may trim the loose edges. Do not remove tape strips completely unless your doctor says it is okay.  Do not take baths, swim, or use a hot tub until your doctor says it is okay. Ask your doctor if you can take showers. You may only be allowed to take sponge baths for bathing.  Check your surgical cut area every day for signs of infection. Check  for: ? More redness, swelling, or pain. ? More fluid or blood. ? Warmth. ? Pus or a bad smell. Activity  Do not drive or use heavy machinery while taking prescription pain medicine.  Do not lift anything that is heavier than 10 lb (4.5 kg) until your doctor says it is okay.  Do not play contact sports until your doctor says it is okay.  Do not drive for 24 hours if you were given a medicine to help you relax (sedative).  Rest as needed. Do not return to work or school until your doctor says it is okay. General instructions  Take over-the-counter and prescription medicines only as told by your doctor.  To prevent or treat constipation while you are taking prescription pain medicine, your doctor may recommend that you: ? Drink enough fluid to keep your pee (urine) clear or pale yellow. ? Take over-the-counter or prescription medicines. ? Eat foods that are high in fiber, such as fresh fruits and vegetables, whole grains, and beans. ? Limit foods that are high in fat and processed sugars, such as fried and sweet foods. Contact a doctor if:  You develop a rash.  You have more redness, swelling, or pain around your surgical cuts.  You have more fluid or blood coming from your surgical cuts.  Your surgical cuts feel warm to the touch.  You have pus or a bad smell coming from your surgical cuts.  You have a fever.  One or  more of your surgical cuts breaks open. Get help right away if:  You have trouble breathing.  You have chest pain.  You have pain that is getting worse in your shoulders.  You faint or feel dizzy when you stand.  You have very bad pain in your belly (abdomen).  You are sick to your stomach (nauseous) for more than one day.  You have throwing up (vomiting) that lasts for more than one day.  You have leg pain. This information is not intended to replace advice given to you by your health care provider. Make sure you discuss any questions you have with  your health care provider. Document Released: 08/19/2008 Document Revised: 05/31/2016 Document Reviewed: 04/28/2016 Elsevier Interactive Patient Education  2018 [employer].  Incentive Spirometer An incentive spirometer is a tool that measures how well you are filling your lungs with each breath. This tool can help keep your lungs clear and active. Taking long, deep breaths may help reverse or decrease the chance of developing breathing (pulmonary) problems, especially infection, following:  Surgery of the chest or abdomen.  Surgery if you have a history of smoking or a lung problem.  A long period of time when you are unable to move or be active.  If the spirometer includes an indicator to show your best effort, your health care provider or respiratory therapist will help you set a goal. Keep a log of your progress if directed by your health care provider. What are the risks?  Breathing too quickly may cause dizziness or cause you to pass out. Take your time so you do not get dizzy or lightheaded.  If you are in pain, you may need to take or ask for pain medicine before doing incentive spirometry. It is harder to take a deep breath if you are having pain. How to use your incentive spirometer 1. Sit on the edge of your bed if possible, or sit up as far as you can in bed or on a chair. 2. Hold the incentive spirometer in an upright position. 3. Breathe out normally. 4. Place the mouthpiece in your mouth and seal your lips tightly around it. 5. Breathe in slowly and as deeply as possible, raising the piston or the ball toward the top of the column. 6. Hold your breath for 3-5 seconds or for as long as possible. Allow the piston or ball to fall to the bottom of the column. 7. Remove the mouthpiece from your mouth and breathe out normally. 8. The spirometer may include an indicator to show your best effort. Use the indicator as a goal to work toward during each repetition. 9. Rest for a few  seconds and repeat this at least 10 times, every 1-2 hours when you are awake. Take your time and take a few normal breaths between deep breaths. Breathing too quickly may cause dizziness or cause you to pass out. Take your time so you do not get dizzy or lightheaded. 10. After each set of 10 deep breaths, practice coughing to be sure your lungs are clear. If you had a surgical cut (incision) made during surgery, support your incision when coughing by placing a pillow or rolled-up towel firmly against it. Once you are able to get out of bed, walk around indoors and cough well. You may stop using the incentive spirometer when instructed by your health care provider. Contact a health care provider if:  You are having difficulty using the spirometer.  You have trouble using the  spirometer as often as instructed.  Your pain medicine is not giving enough relief while using the spirometer.  You have a fever.  You develop shortness of breath. Get help right away if:  You develop a cough with bloody sputum.  You develop worsening pain, redness, or discharge at or near the incision site. This information is not intended to replace advice given to you by your health care provider. Make sure you discuss any questions you have with your health care provider. Document Released: 03/23/2007 Document Revised: 08/04/2016 Document Reviewed: 06/19/2014 Elsevier Interactive Patient Education  2018 [employer]      DASH Eating Plan DASH stands for "Dietary Approaches to Stop Hypertension." The DASH eating plan is a healthy eating plan that has been shown to reduce high blood pressure (hypertension). It may also reduce your risk for type 2 diabetes, heart disease, and stroke. The DASH eating plan may also help with weight loss. What are tips for following this plan? General guidelines  Avoid eating more than 2,300 mg (milligrams) of salt (sodium) a day. If you have hypertension, you may need to reduce  your sodium intake to 1,500 mg a day.  Limit alcohol intake to no more than 1 drink a day for nonpregnant women and 2 drinks a day for men. One drink equals 12 oz of beer, 5 oz of wine, or 1 oz of hard liquor.  Work with your health care provider to maintain a healthy body weight or to lose weight. Ask what an ideal weight is for you.  Get at least 30 minutes of exercise that causes your heart to beat faster (aerobic exercise) most days of the week. Activities may include walking, swimming, or biking.  Work with your health care provider or diet and nutrition specialist (dietitian) to adjust your eating plan to your individual calorie needs. Reading food labels  Check food labels for the amount of sodium per serving. Choose foods with less than 5 percent of the Daily Value of sodium. Generally, foods with less than 300 mg of sodium per serving fit into this eating plan.  To find whole grains, look for the word "whole" as the first word in the ingredient list. Shopping  Buy products labeled as "low-sodium" or "no salt added."  Buy fresh foods. Avoid canned foods and premade or frozen meals. Cooking  Avoid adding salt when cooking. Use salt-free seasonings or herbs instead of table salt or sea salt. Check with your health care provider or pharmacist before using salt substitutes.  Do not fry foods. Cook foods using healthy methods such as baking, boiling, grilling, and broiling instead.  Cook with heart-healthy oils, such as olive, canola, soybean, or sunflower oil. Meal planning   Eat a balanced diet that includes: ? 5 or more servings of fruits and vegetables each day. At each meal, try to fill half of your plate with fruits and vegetables. ? Up to 6-8 servings of whole grains each day. ? Less than 6 oz of lean meat, poultry, or fish each day. A 3-oz serving of meat is about the same size as a deck of cards. One egg equals 1 oz. ? 2 servings of low-fat dairy each day. ? A serving  of nuts, seeds, or beans 5 times each week. ? Heart-healthy fats. Healthy fats called Omega-3 fatty acids are found in foods such as flaxseeds and coldwater fish, like sardines, salmon, and mackerel.  Limit how much you eat of the following: ? Canned or prepackaged foods. ?  Food that is high in trans fat, such as fried foods. ? Food that is high in saturated fat, such as fatty meat. ? Sweets, desserts, sugary drinks, and other foods with added sugar. ? Full-fat dairy products.  Do not salt foods before eating.  Try to eat at least 2 vegetarian meals each week.  Eat more home-cooked food and less restaurant, buffet, and fast food.  When eating at a restaurant, ask that your food be prepared with less salt or no salt, if possible. What foods are recommended? The items listed may not be a complete list. Talk with your dietitian about what dietary choices are best for you. Grains Whole-grain or whole-wheat bread. Whole-grain or whole-wheat pasta. Brown rice. Orpah Cobb. Bulgur. Whole-grain and low-sodium cereals. Pita bread. Low-fat, low-sodium crackers. Whole-wheat flour tortillas. Vegetables Fresh or frozen vegetables (raw, steamed, roasted, or grilled). Low-sodium or reduced-sodium tomato and vegetable juice. Low-sodium or reduced-sodium tomato sauce and tomato paste. Low-sodium or reduced-sodium canned vegetables. Fruits All fresh, dried, or frozen fruit. Canned fruit in natural juice (without added sugar). Meat and other protein foods Skinless chicken or [place]. Ground chicken or [place]. Pork with fat trimmed off. Fish and seafood. Egg whites. Dried beans, peas, or lentils. Unsalted nuts, nut butters, and seeds. Unsalted canned beans. Lean cuts of beef with fat trimmed off. Low-sodium, lean deli meat. Dairy Low-fat (1%) or fat-free (skim) milk. Fat-free, low-fat, or reduced-fat cheeses. Nonfat, low-sodium ricotta or cottage cheese. Low-fat or nonfat yogurt. Low-fat, low-sodium  cheese. Fats and oils Soft margarine without trans fats. Vegetable oil. Low-fat, reduced-fat, or light mayonnaise and salad dressings (reduced-sodium). Canola, safflower, olive, soybean, and sunflower oils. Avocado. Seasoning and other foods Herbs. Spices. Seasoning mixes without salt. Unsalted popcorn and pretzels. Fat-free sweets. What foods are not recommended? The items listed may not be a complete list. Talk with your dietitian about what dietary choices are best for you. Grains Baked goods made with fat, such as croissants, muffins, or some breads. Dry pasta or rice meal packs. Vegetables Creamed or fried vegetables. Vegetables in a cheese sauce. Regular canned vegetables (not low-sodium or reduced-sodium). Regular canned tomato sauce and paste (not low-sodium or reduced-sodium). Regular tomato and vegetable juice (not low-sodium or reduced-sodium). Rosita Fire. Olives. Fruits Canned fruit in a light or heavy syrup. Fried fruit. Fruit in cream or butter sauce. Meat and other protein foods Fatty cuts of meat. Ribs. Fried meat. Tomasa Blase. Sausage. Bologna and other processed lunch meats. Salami. Fatback. Hotdogs. Bratwurst. Salted nuts and seeds. Canned beans with added salt. Canned or smoked fish. Whole eggs or egg yolks. Chicken or [place] with skin. Dairy Whole or 2% milk, cream, and half-and-half. Whole or full-fat cream cheese. Whole-fat or sweetened yogurt. Full-fat cheese. Nondairy creamers. Whipped toppings. Processed cheese and cheese spreads. Fats and oils Butter. Stick margarine. Lard. Shortening. Ghee. Bacon fat. Tropical oils, such as coconut, palm kernel, or palm oil. Seasoning and other foods Salted popcorn and pretzels. Onion salt, garlic salt, seasoned salt, table salt, and sea salt. Worcestershire sauce. Tartar sauce. Barbecue sauce. Teriyaki sauce. Soy sauce, including reduced-sodium. Steak sauce. Canned and packaged gravies. Fish sauce. Oyster sauce. Cocktail sauce. Horseradish  that you find on the shelf. Ketchup. Mustard. Meat flavorings and tenderizers. Bouillon cubes. Hot sauce and Tabasco sauce. Premade or packaged marinades. Premade or packaged taco seasonings. Relishes. Regular salad dressings. Where to find more information:  National Heart, Lung, and Blood Institute: PopSteam.is  American Heart Association: www.heart.org Summary  The DASH eating plan is  a healthy eating plan that has been shown to reduce high blood pressure (hypertension). It may also reduce your risk for type 2 diabetes, heart disease, and stroke.  With the DASH eating plan, you should limit salt (sodium) intake to 2,300 mg a day. If you have hypertension, you may need to reduce your sodium intake to 1,500 mg a day.  When on the DASH eating plan, aim to eat more fresh fruits and vegetables, whole grains, lean proteins, low-fat dairy, and heart-healthy fats.  Work with your health care provider or diet and nutrition specialist (dietitian) to adjust your eating plan to your individual calorie needs. This information is not intended to replace advice given to you by your health care provider. Make sure you discuss any questions you have with your health care provider. Document Released: 10/30/2011 Document Revised: 11/03/2016 Document Reviewed: 11/03/2016 Elsevier Interactive Patient Education  2018 [employer].   How to Take Your Blood Pressure You can take your blood pressure at home with a machine. You may need to check your blood pressure at home:  To check if you have high blood pressure (hypertension).  To check your blood pressure over time.  To make sure your blood pressure medicine is working.  Supplies needed: You will need a blood pressure machine, or monitor. You can buy one at a drugstore or online. When choosing one:  Choose one with an arm cuff.  Choose one that wraps around your upper arm. Only one finger should fit between your arm and the cuff.  Do not  choose one that measures your blood pressure from your wrist or finger.  Your doctor can suggest a monitor. How to prepare Avoid these things for 30 minutes before checking your blood pressure:  Drinking caffeine.  Drinking alcohol.  Eating.  Smoking.  Exercising.  Five minutes before checking your blood pressure:  Pee.  Sit in a dining chair. Avoid sitting in a soft couch or armchair.  Be quiet. Do not talk.  How to take your blood pressure Follow the instructions that came with your machine. If you have a digital blood pressure monitor, these may be the instructions: 1. Sit up straight. 2. Place your feet on the floor. Do not cross your ankles or legs. 3. Rest your left arm at the level of your heart. You may rest it on a table, desk, or chair. 4. Pull up your shirt sleeve. 5. Wrap the blood pressure cuff around the upper part of your left arm. The cuff should be 1 inch (2.5 cm) above your elbow. It is best to wrap the cuff around bare skin. 6. Fit the cuff snugly around your arm. You should be able to place only one finger between the cuff and your arm. 7. Put the cord inside the groove of your elbow. 8. Press the power button. 9. Sit quietly while the cuff fills with air and loses air. 10. Write down the numbers on the screen. 11. Wait 2-3 minutes and then repeat steps 1-10.  What do the numbers mean? Two numbers make up your blood pressure. The first number is called systolic pressure. The second is called diastolic pressure. An example of a blood pressure reading is "120 over 80" (or 120/80). If you are an adult and do not have a medical condition, use this guide to find out if your blood pressure is normal: Normal  First number: below 120.  Second number: below 80. Elevated  First number: 120-129.  Second number: below  80. Hypertension stage 1  First number: 130-139.  Second number: 80-89. Hypertension stage 2  First number: 140 or above.  Second  number: 90 or above. Your blood pressure is above normal even if only the top or bottom number is above normal. Follow these instructions at home:  Check your blood pressure as often as your doctor tells you to.  Take your monitor to your next doctor's appointment. Your doctor will: ? Make sure you are using it correctly. ? Make sure it is working right.  Make sure you understand what your blood pressure numbers should be.  Tell your doctor if your medicines are causing side effects. Contact a doctor if:  Your blood pressure keeps being high. Get help right away if:  Your first blood pressure number is higher than 180.  Your second blood pressure number is higher than 120. This information is not intended to replace advice given to you by your health care provider. Make sure you discuss any questions you have with your health care provider. Document Released: 10/23/2008 Document Revised: 10/08/2016 Document Reviewed: 04/18/2016 Elsevier Interactive Patient Education  2018 [employer].   Managing Your Hypertension Hypertension is commonly called high blood pressure. This is when the force of your blood pressing against the walls of your arteries is too strong. Arteries are blood vessels that carry blood from your heart throughout your body. Hypertension forces the heart to work harder to pump blood, and may cause the arteries to become narrow or stiff. Having untreated or uncontrolled hypertension can cause heart attack, stroke, kidney disease, and other problems. What are blood pressure readings? A blood pressure reading consists of a higher number over a lower number. Ideally, your blood pressure should be below 120/80. The first ("top") number is called the systolic pressure. It is a measure of the pressure in your arteries as your heart beats. The second ("bottom") number is called the diastolic pressure. It is a measure of the pressure in your arteries as the heart relaxes. What  does my blood pressure reading mean? Blood pressure is classified into four stages. Based on your blood pressure reading, your health care provider may use the following stages to determine what type of treatment you need, if any. Systolic pressure and diastolic pressure are measured in a unit called mm Hg. Normal  Systolic pressure: below 120.  Diastolic pressure: below 80. Elevated  Systolic pressure: 120-129.  Diastolic pressure: below 80. Hypertension stage 1  Systolic pressure: 130-139.  Diastolic pressure: 80-89. Hypertension stage 2  Systolic pressure: 140 or above.  Diastolic pressure: 90 or above. What health risks are associated with hypertension? Managing your hypertension is an important responsibility. Uncontrolled hypertension can lead to:  A heart attack.  A stroke.  A weakened blood vessel (aneurysm).  Heart failure.  Kidney damage.  Eye damage.  Metabolic syndrome.  Memory and concentration problems.  What changes can I make to manage my hypertension? Hypertension can be managed by making lifestyle changes and possibly by taking medicines. Your health care provider will help you make a plan to bring your blood pressure within a normal range. Eating and drinking  Eat a diet that is high in fiber and potassium, and low in salt (sodium), added sugar, and fat. An example eating plan is called the DASH (Dietary Approaches to Stop Hypertension) diet. To eat this way: ? Eat plenty of fresh fruits and vegetables. Try to fill half of your plate at each meal with fruits and vegetables. ?  Eat whole grains, such as whole wheat pasta, brown rice, or whole grain bread. Fill about one quarter of your plate with whole grains. ? Eat low-fat diary products. ? Avoid fatty cuts of meat, processed or cured meats, and poultry with skin. Fill about one quarter of your plate with lean proteins such as fish, chicken without skin, beans, eggs, and tofu. ? Avoid premade and  processed foods. These tend to be higher in sodium, added sugar, and fat.  Reduce your daily sodium intake. Most people with hypertension should eat less than 1,500 mg of sodium a day.  Limit alcohol intake to no more than 1 drink a day for nonpregnant women and 2 drinks a day for men. One drink equals 12 oz of beer, 5 oz of wine, or 1 oz of hard liquor. Lifestyle  Work with your health care provider to maintain a healthy body weight, or to lose weight. Ask what an ideal weight is for you.  Get at least 30 minutes of exercise that causes your heart to beat faster (aerobic exercise) most days of the week. Activities may include walking, swimming, or biking.  Include exercise to strengthen your muscles (resistance exercise), such as weight lifting, as part of your weekly exercise routine. Try to do these types of exercises for 30 minutes at least 3 days a week.  Do not use any products that contain nicotine or tobacco, such as cigarettes and e-cigarettes. If you need help quitting, ask your health care provider.  Control any long-term (chronic) conditions you have, such as high cholesterol or diabetes. Monitoring  Monitor your blood pressure at home as told by your health care provider. Your personal target blood pressure may vary depending on your medical conditions, your age, and other factors.  Have your blood pressure checked regularly, as often as told by your health care provider. Working with your health care provider  Review all the medicines you take with your health care provider because there may be side effects or interactions.  Talk with your health care provider about your diet, exercise habits, and other lifestyle factors that may be contributing to hypertension.  Visit your health care provider regularly. Your health care provider can help you create and adjust your plan for managing hypertension. Will I need medicine to control my blood pressure? Your health care provider  may prescribe medicine if lifestyle changes are not enough to get your blood pressure under control, and if:  Your systolic blood pressure is 130 or higher.  Your diastolic blood pressure is 80 or higher.  Take medicines only as told by your health care provider. Follow the directions carefully. Blood pressure medicines must be taken as prescribed. The medicine does not work as well when you skip doses. Skipping doses also puts you at risk for problems. Contact a health care provider if:  You think you are having a reaction to medicines you have taken.  You have repeated (recurrent) headaches.  You feel dizzy.  You have swelling in your ankles.  You have trouble with your vision. Get help right away if:  You develop a severe headache or confusion.  You have unusual weakness or numbness, or you feel faint.  You have severe pain in your chest or abdomen.  You vomit repeatedly.  You have trouble breathing. Summary  Hypertension is when the force of blood pumping through your arteries is too strong. If this condition is not controlled, it may put you at risk for serious complications.  Your personal target blood pressure may vary depending on your medical conditions, your age, and other factors. For most people, a normal blood pressure is less than 120/80.  Hypertension is managed by lifestyle changes, medicines, or both. Lifestyle changes include weight loss, eating a healthy, low-sodium diet, exercising more, and limiting alcohol. This information is not intended to replace advice given to you by your health care provider. Make sure you discuss any questions you have with your health care provider. Document Released: 08/04/2012 Document Revised: 10/08/2016 Document Reviewed: 10/08/2016 Elsevier Interactive Patient Education  [employer].

## 2018-06-30 NOTE — Progress Notes (Signed)
[facility] Progress Note  2 Days Post-Op  Subjective: Still with some low grade fevers but improved with IS and ambulating. No pain complaints this Am. Did not take much meds overnight and tolerating some diet. BP is very elevated with diastolic > 100. No complaints this AM.   Objective: Vital signs in last 24 hours: Temp:  [99.1 F (37.3 C)-100.8 F (38.2 C)] 100.8 F (38.2 C) (08/07 0557) Pulse Rate:  [95-110] 108 (08/07 0857) Resp:  [17-18] 17 (08/07 0557) BP: (168-186)/(94-124) 174/118 (08/07 0857) SpO2:  [94 %-100 %] 100 % (08/07 0557) Last BM Date: 06/27/18  Intake/Output from previous day: 08/06 0701 - 08/07 0700 In: 240 [P.O.:240] Out: 220 [Drains:220] Intake/Output this shift: No intake/output data recorded.  General appearance: alert, cooperative and no distress Resp: normal work breathing GI: soft, appropriately tender, no rebound or guarding, port site c/d/i with dermabond, JP with SS output, no bile staining noted   Lab Results:  Recent Labs    06/27/18 1238 06/28/18 0603  WBC 6.8 9.7  HGB 11.2* 10.8*  HCT 35.2* 33.9*  PLT 337 337   BMET Recent Labs    06/27/18 1238 06/28/18 0603  NA 140 138  K 2.9* 3.0*  CL 99 101  CO2 34* 31  GLUCOSE 109* 96  BUN 8 6  CREATININE 0.73 0.70  CALCIUM 9.2 8.2*   Anti-infectives: Anti-infectives (From admission, onward)   Start     Dose/Rate Route Frequency Ordered Stop   06/28/18 1115  cefoTEtan (CEFOTAN) 2 g in sodium chloride 0.9 % 100 mL IVPB     2 g 200 mL/hr over 30 Minutes Intravenous On call to O.R. 06/28/18 1113 06/28/18 1900   06/27/18 1830  cefTRIAXone (ROCEPHIN) 2 g in sodium chloride 0.9 % 100 mL IVPB     2 g 200 mL/hr over 30 Minutes Intravenous  Once 06/27/18 1828 06/27/18 1908      Assessment/Plan: Ms. Evaristo BuryJasper is 27 yo POD 2 s/p Lap chole for cholecystitis. Doing better. Fever improved with ambulating and IS, so atelectasis is likely main cause with some degree of rebound  from her cholecystitis. Overall doing better.  -PRN for pain -Diet as tolerated -Continue IS and ambulating, have instructed patient on IS at home and if fever persisting to do IS and to call / go to ED if high fever that is not resolving  -JP out prior to discharge -Dr. Laural BenesJohnson working on BP meds and PCP follow up -Once medically able can d/c home from surgery standpoint  -Will see in 2 weeks    LOS: 0 days    Lucretia RoersLindsay C Ashling Roane 06/30/2018

## 2018-07-13 ENCOUNTER — Ambulatory Visit: Payer: Self-pay | Admitting: General Surgery

## 2018-07-15 ENCOUNTER — Encounter: Payer: Self-pay | Admitting: General Surgery

## 2018-07-15 ENCOUNTER — Ambulatory Visit (INDEPENDENT_AMBULATORY_CARE_PROVIDER_SITE_OTHER): Payer: Self-pay | Admitting: General Surgery

## 2018-07-15 VITALS — BP 158/106 | HR 82 | Temp 98.0°F | Resp 20 | Wt 246.0 lb

## 2018-07-15 DIAGNOSIS — K810 Acute cholecystitis: Secondary | ICD-10-CM

## 2018-07-15 NOTE — Progress Notes (Signed)
[facility] Note   HPI:  27 y.o. Female presents to clinic for post-op follow-up evaluation after a laparoscopic cholecystectomy. Patient reports she is doing well. No issues with pain and tolerating a diet.   Review of Systems:  No fevers or chills Eating Pain improved  All other review of systems: otherwise negative   Pathology: Diagnosis Gallbladder, and gallstones - ACUTE CHOLECYSTITIS WITH EXTENSIVE HEMORRHAGE. - CHOLELITHIASIS. - BENIGN REACTIVE LYMPH NODE.  Vital Signs:  BP (!) 158/106 (BP Location: Left Arm, Patient Position: Sitting, Cuff Size: Large)   Pulse 82   Temp 98 F (36.7 C) (Temporal)   Resp 20   Wt 246 lb (111.6 kg)   LMP 06/16/2018 Comment: neg u preg 06/27/18  BMI 46.48 kg/m    Physical Exam:  Physical Exam  Cardiovascular: Normal rate.  Pulmonary/Chest: Effort normal.  Abdominal: Soft. She exhibits no distension. There is no tenderness.  Port sites healed no erythema or drainaged  Vitals reviewed.   Laboratory studies: None   Imaging:  None    Assessment:  27 y.o. yo Female s/p Lap cholecystectomy for cholecystitis. Doing well. HTN is still not controlled. She says she has an appointment for PCP and is taking meds.   Plan:  - Follow up PRN   - Warned of risk of umbilical hernia due to size of gallbladder and extracting from this site. If has bulge, pain in future 2+ months from now, let us know.  - HTN discussed, and risk of CKD, etc discussed, encouraged her to take the meds and see PCP    All of the above recommendations were discussed with the patient, and all of patient's questions were answered to her expressed satisfaction.  Algis GreenhouseLindsay Ambyr Qadri, MD [facility] [address] [place], [place] [phone] (office)

## 2018-07-15 NOTE — Patient Instructions (Signed)
Activity as tolerated. Diet as tolerated. Find a Primary Care Physician for your Hypertension.   Hypertension Hypertension is another name for high blood pressure. High blood pressure forces your heart to work harder to pump blood. This can cause problems over time. There are two numbers in a blood pressure reading. There is a top number (systolic) over a bottom number (diastolic). It is best to have a blood pressure below 120/80. Healthy choices can help lower your blood pressure. You may need medicine to help lower your blood pressure if:  Your blood pressure cannot be lowered with healthy choices.  Your blood pressure is higher than 130/80.  Follow these instructions at home: Eating and drinking  If directed, follow the DASH eating plan. This diet includes: ? Filling half of your plate at each meal with fruits and vegetables. ? Filling one quarter of your plate at each meal with whole grains. Whole grains include whole wheat pasta, brown rice, and whole grain bread. ? Eating or drinking low-fat dairy products, such as skim milk or low-fat yogurt. ? Filling one quarter of your plate at each meal with low-fat (lean) proteins. Low-fat proteins include fish, skinless chicken, eggs, beans, and tofu. ? Avoiding fatty meat, cured and processed meat, or chicken with skin. ? Avoiding premade or processed food.  Eat less than 1,500 mg of salt (sodium) a day.  Limit alcohol use to no more than 1 drink a day for nonpregnant women and 2 drinks a day for men. One drink equals 12 oz of beer, 5 oz of wine, or 1 oz of hard liquor. Lifestyle  Work with your doctor to stay at a healthy weight or to lose weight. Ask your doctor what the best weight is for you.  Get at least 30 minutes of exercise that causes your heart to beat faster (aerobic exercise) most days of the week. This may include walking, swimming, or biking.  Get at least 30 minutes of exercise that strengthens your muscles (resistance  exercise) at least 3 days a week. This may include lifting weights or pilates.  Do not use any products that contain nicotine or tobacco. This includes cigarettes and e-cigarettes. If you need help quitting, ask your doctor.  Check your blood pressure at home as told by your doctor.  Keep all follow-up visits as told by your doctor. This is important. Medicines  Take over-the-counter and prescription medicines only as told by your doctor. Follow directions carefully.  Do not skip doses of blood pressure medicine. The medicine does not work as well if you skip doses. Skipping doses also puts you at risk for problems.  Ask your doctor about side effects or reactions to medicines that you should watch for. Contact a doctor if:  You think you are having a reaction to the medicine you are taking.  You have headaches that keep coming back (recurring).  You feel dizzy.  You have swelling in your ankles.  You have trouble with your vision. Get help right away if:  You get a very bad headache.  You start to feel confused.  You feel weak or numb.  You feel faint.  You get very bad pain in your: ? Chest. ? Belly (abdomen).  You throw up (vomit) more than once.  You have trouble breathing. Summary  Hypertension is another name for high blood pressure.  Making healthy choices can help lower blood pressure. If your blood pressure cannot be controlled with healthy choices, you may need to take  medicine. This information is not intended to replace advice given to you by your health care provider. Make sure you discuss any questions you have with your health care provider. Document Released: 04/28/2008 Document Revised: 10/08/2016 Document Reviewed: 10/08/2016 Elsevier Interactive Patient Education  2018 [employer].   Managing Your Hypertension Hypertension is commonly called high blood pressure. This is when the force of your blood pressing against the walls of your arteries  is too strong. Arteries are blood vessels that carry blood from your heart throughout your body. Hypertension forces the heart to work harder to pump blood, and may cause the arteries to become narrow or stiff. Having untreated or uncontrolled hypertension can cause heart attack, stroke, kidney disease, and other problems. What are blood pressure readings? A blood pressure reading consists of a higher number over a lower number. Ideally, your blood pressure should be below 120/80. The first ("top") number is called the systolic pressure. It is a measure of the pressure in your arteries as your heart beats. The second ("bottom") number is called the diastolic pressure. It is a measure of the pressure in your arteries as the heart relaxes. What does my blood pressure reading mean? Blood pressure is classified into four stages. Based on your blood pressure reading, your health care provider may use the following stages to determine what type of treatment you need, if any. Systolic pressure and diastolic pressure are measured in a unit called mm Hg. Normal  Systolic pressure: below 120.  Diastolic pressure: below 80. Elevated  Systolic pressure: 120-129.  Diastolic pressure: below 80. Hypertension stage 1  Systolic pressure: 130-139.  Diastolic pressure: 80-89. Hypertension stage 2  Systolic pressure: 140 or above.  Diastolic pressure: 90 or above. What health risks are associated with hypertension? Managing your hypertension is an important responsibility. Uncontrolled hypertension can lead to:  A heart attack.  A stroke.  A weakened blood vessel (aneurysm).  Heart failure.  Kidney damage.  Eye damage.  Metabolic syndrome.  Memory and concentration problems.  What changes can I make to manage my hypertension? Hypertension can be managed by making lifestyle changes and possibly by taking medicines. Your health care provider will help you make a plan to bring your blood  pressure within a normal range. Eating and drinking  Eat a diet that is high in fiber and potassium, and low in salt (sodium), added sugar, and fat. An example eating plan is called the DASH (Dietary Approaches to Stop Hypertension) diet. To eat this way: ? Eat plenty of fresh fruits and vegetables. Try to fill half of your plate at each meal with fruits and vegetables. ? Eat whole grains, such as whole wheat pasta, brown rice, or whole grain bread. Fill about one quarter of your plate with whole grains. ? Eat low-fat diary products. ? Avoid fatty cuts of meat, processed or cured meats, and poultry with skin. Fill about one quarter of your plate with lean proteins such as fish, chicken without skin, beans, eggs, and tofu. ? Avoid premade and processed foods. These tend to be higher in sodium, added sugar, and fat.  Reduce your daily sodium intake. Most people with hypertension should eat less than 1,500 mg of sodium a day.  Limit alcohol intake to no more than 1 drink a day for nonpregnant women and 2 drinks a day for men. One drink equals 12 oz of beer, 5 oz of wine, or 1 oz of hard liquor. Lifestyle  Work with your health care provider  to maintain a healthy body weight, or to lose weight. Ask what an ideal weight is for you.  Get at least 30 minutes of exercise that causes your heart to beat faster (aerobic exercise) most days of the week. Activities may include walking, swimming, or biking.  Include exercise to strengthen your muscles (resistance exercise), such as weight lifting, as part of your weekly exercise routine. Try to do these types of exercises for 30 minutes at least 3 days a week.  Do not use any products that contain nicotine or tobacco, such as cigarettes and e-cigarettes. If you need help quitting, ask your health care provider.  Control any long-term (chronic) conditions you have, such as high cholesterol or diabetes. Monitoring  Monitor your blood pressure at home as  told by your health care provider. Your personal target blood pressure may vary depending on your medical conditions, your age, and other factors.  Have your blood pressure checked regularly, as often as told by your health care provider. Working with your health care provider  Review all the medicines you take with your health care provider because there may be side effects or interactions.  Talk with your health care provider about your diet, exercise habits, and other lifestyle factors that may be contributing to hypertension.  Visit your health care provider regularly. Your health care provider can help you create and adjust your plan for managing hypertension. Will I need medicine to control my blood pressure? Your health care provider may prescribe medicine if lifestyle changes are not enough to get your blood pressure under control, and if:  Your systolic blood pressure is 130 or higher.  Your diastolic blood pressure is 80 or higher.  Take medicines only as told by your health care provider. Follow the directions carefully. Blood pressure medicines must be taken as prescribed. The medicine does not work as well when you skip doses. Skipping doses also puts you at risk for problems. Contact a health care provider if:  You think you are having a reaction to medicines you have taken.  You have repeated (recurrent) headaches.  You feel dizzy.  You have swelling in your ankles.  You have trouble with your vision. Get help right away if:  You develop a severe headache or confusion.  You have unusual weakness or numbness, or you feel faint.  You have severe pain in your chest or abdomen.  You vomit repeatedly.  You have trouble breathing. Summary  Hypertension is when the force of blood pumping through your arteries is too strong. If this condition is not controlled, it may put you at risk for serious complications.  Your personal target blood pressure may vary depending  on your medical conditions, your age, and other factors. For most people, a normal blood pressure is less than 120/80.  Hypertension is managed by lifestyle changes, medicines, or both. Lifestyle changes include weight loss, eating a healthy, low-sodium diet, exercising more, and limiting alcohol. This information is not intended to replace advice given to you by your health care provider. Make sure you discuss any questions you have with your health care provider. Document Released: 08/04/2012 Document Revised: 10/08/2016 Document Reviewed: 10/08/2016 Elsevier Interactive Patient Education  [employer].

## 2018-07-29 ENCOUNTER — Encounter (HOSPITAL_COMMUNITY): Payer: Self-pay | Admitting: Emergency Medicine

## 2018-07-29 ENCOUNTER — Emergency Department (HOSPITAL_COMMUNITY)
Admission: EM | Admit: 2018-07-29 | Discharge: 2018-07-29 | Disposition: A | Discharge status: Home / Self Care | Payer: Self-pay | Attending: Emergency Medicine | Admitting: Emergency Medicine

## 2018-07-29 ENCOUNTER — Other Ambulatory Visit: Payer: Self-pay

## 2018-07-29 DIAGNOSIS — I10 Essential (primary) hypertension: Secondary | ICD-10-CM | POA: Insufficient documentation

## 2018-07-29 DIAGNOSIS — Z79899 Other long term (current) drug therapy: Secondary | ICD-10-CM | POA: Insufficient documentation

## 2018-07-29 LAB — BASIC METABOLIC PANEL
ANION GAP: 8 (ref 5–15)
BUN: 11 mg/dL (ref 6–20)
CHLORIDE: 101 mmol/L (ref 98–111)
CO2: 29 mmol/L (ref 22–32)
Calcium: 9.2 mg/dL (ref 8.9–10.3)
Creatinine, Ser: 0.74 mg/dL (ref 0.44–1.00)
GFR calc non Af Amer: >60 mL/min (ref >60)
Glucose, Bld: 95 mg/dL (ref 70–99)
Potassium: 3 mmol/L — ABNORMAL LOW (ref 3.5–5.1)
SODIUM: 138 mmol/L (ref 135–145)

## 2018-07-29 LAB — CBC WITH DIFFERENTIAL/PLATELET
BASOS ABS: 0 10*3/uL (ref 0.0–0.1)
BASOS PCT: 1 %
EOS ABS: 0.5 10*3/uL (ref 0.0–0.7)
EOS PCT: 7 %
HEMATOCRIT: 36.3 % (ref 36.0–46.0)
HEMOGLOBIN: 11.5 g/dL — AB (ref 12.0–15.0)
Lymphocytes Relative: 34 %
Lymphs Abs: 2.1 10*3/uL (ref 0.7–4.0)
MCH: 26.9 pg (ref 26.0–34.0)
MCHC: 31.7 g/dL (ref 30.0–36.0)
MCV: 85 fL (ref 78.0–100.0)
Monocytes Absolute: 0.5 10*3/uL (ref 0.1–1.0)
Monocytes Relative: 9 %
Neutro Abs: 3.1 10*3/uL (ref 1.7–7.7)
Neutrophils Relative %: 49 %
Platelets: 247 10*3/uL (ref 150–400)
RBC: 4.27 MIL/uL (ref 3.87–5.11)
RDW: 15.1 % (ref 11.5–15.5)
WBC: 6.2 10*3/uL (ref 4.0–10.5)

## 2018-07-29 MED ORDER — CLONIDINE HCL 0.1 MG PO TABS: 0.1000 mg | ORAL_TABLET | Freq: Two times a day (BID) | ORAL | 1 refills | Status: DC

## 2018-07-29 MED ORDER — METOPROLOL TARTRATE 50 MG PO TABS: 50.0000 mg | ORAL_TABLET | Freq: Two times a day (BID) | ORAL | 1 refills | Status: DC

## 2018-07-29 MED ORDER — LISINOPRIL-HYDROCHLOROTHIAZIDE 20-25 MG PO TABS: 1.0000 | ORAL_TABLET | Freq: Every day | ORAL | 1 refills | Status: DC

## 2018-07-29 MED ORDER — CLONIDINE HCL 0.1 MG PO TABS
0.1000 mg | ORAL_TABLET | Freq: Once | ORAL | Status: AC
  Administered 2018-07-29: 0.1 mg via ORAL
  Filled 2018-07-29: qty 1

## 2018-07-29 NOTE — ED Provider Notes (Signed)
[facility] EMERGENCY DEPARTMENT Provider Note   CSN: [id number] Arrival date & time: 07/29/18  1316     History   Chief Complaint Chief Complaint  Patient presents with  . Hypertension    HPI Leah Palmer is a 27 y.o. female.  The history is provided by the patient and medical records.  Hypertension  This is a chronic problem. The current episode started more than 1 week ago. The problem occurs constantly. The problem has not changed since onset.Pertinent negatives include no chest pain, no abdominal pain, no headaches and no shortness of breath. Nothing aggravates the symptoms. Nothing relieves the symptoms. Treatments tried: On several antihypertensives. The treatment provided no relief.   The patient is a 27 year old female, she has a known history of high blood pressure which she states she was diagnosed with within the last 6 or 7 years, she reports that she had not been on any treatment for this blood pressure until she was admitted to the hospital approximately 1 month ago, she had acute cholecystitis and underwent a laparoscopic cholecystectomy which was quite successful and the patient was started on multiple blood pressure medications including hydralazine, lisinopril and hydrochlorothiazide combination tablet as well as metoprolol 50 mg twice a day.  She reports that despite taking these medication she continues to have high blood pressure however it is asymptomatic and when she followed up with the health department today they sent her over here because of her significant elevation in blood pressure.  Noted to be 185/110.  The patient specifically denies headache, blurred vision, chest pain, shortness of breath, weakness or numbness.  She did stop taking hydralazine stating that this was causing her to have headaches.  She does endorse taking the other medications as prescribed.  She is not a diabetic, she does not drink smoke or use any drugs, she does take ibuprofen for  pain.  Past Medical History:  Diagnosis Date  . Biliary colic   . Hypertension   . Noncompliance with medication regimen     Patient Active Problem List   Diagnosis Date Noted  . Noncompliance with medication regimen 06/30/2018  . Acute cholecystitis 06/27/2018  . Hypertension   . Biliary colic     Past Surgical History:  Procedure Laterality Date  . CHOLECYSTECTOMY N/A 06/28/2018   Procedure: LAPAROSCOPIC CHOLECYSTECTOMY;  Surgeon: Lucretia Roers, MD;  Location: [facility];  Service: General;  Laterality: N/A;  . INGUINAL HERNIA REPAIR     around 27 years of age  . NO PAST SURGERIES       OB History    Gravida  0   Para  0   Term  0   Preterm  0   AB  0   Living  0     SAB  0   TAB  0   Ectopic  0   Multiple  0   Live Births  0            Home Medications    Prior to Admission medications   Medication Sig Start Date End Date Taking? Authorizing Provider  potassium chloride (K-DUR) 10 MEQ tablet Take 1 tablet (10 mEq total) by mouth daily. 06/30/18 07/30/18 Yes Johnson, Clanford L, MD  cloNIDine (CATAPRES) 0.1 MG tablet Take 1 tablet (0.1 mg total) by mouth 2 (two) times daily. 07/29/18 08/28/18  Eber Hong, MD  lisinopril-hydrochlorothiazide (PRINZIDE,ZESTORETIC) 20-25 MG tablet Take 1 tablet by mouth daily. 07/29/18   Eber Hong, MD  metoprolol tartrate (  LOPRESSOR) 50 MG tablet Take 1 tablet (50 mg total) by mouth 2 (two) times daily. 07/29/18   Eber Hong, MD    Family History Family History  Problem Relation Age of Onset  . Gallbladder disease Mother   . Hypertension Mother   . Diabetes Father     Social History Social History   Tobacco Use  . Smoking status: Never Smoker  . Smokeless tobacco: Never Used  Substance Use Topics  . Alcohol use: Not Currently    Frequency: Never  . Drug use: Never     Allergies   Patient has no known allergies.   Review of Systems Review of Systems  Constitutional: Negative for chills and fever.   HENT: Negative for sore throat.   Eyes: Negative for visual disturbance.  Respiratory: Negative for cough and shortness of breath.   Cardiovascular: Negative for chest pain.  Gastrointestinal: Negative for abdominal pain, diarrhea, nausea and vomiting.  Genitourinary: Negative for dysuria and frequency.  Musculoskeletal: Negative for back pain and neck pain.  Skin: Negative for rash.  Neurological: Negative for weakness, numbness and headaches.  Hematological: Negative for adenopathy.  Psychiatric/Behavioral: Negative for behavioral problems.     Physical Exam Updated Vital Signs BP (!) 185/113 (BP Location: Right Arm)   Pulse 78   Temp 98.6 F (37 C) (Oral)   Resp 18   Ht 1.676 m (5\' 6" )   Wt 111.6 kg   SpO2 100%   BMI 39.71 kg/m   Physical Exam  Constitutional: She appears well-developed and well-nourished. No distress.  HENT:  Head: Normocephalic and atraumatic.  Mouth/Throat: Oropharynx is clear and moist. No oropharyngeal exudate.  Eyes: Pupils are equal, round, and reactive to light. Conjunctivae and EOM are normal. Right eye exhibits no discharge. Left eye exhibits no discharge. No scleral icterus.  Neck: Normal range of motion. Neck supple. No JVD present. No thyromegaly present.  Supple and non tender, no LAD and no thyromegaly  Cardiovascular: Normal rate, regular rhythm, normal heart sounds and intact distal pulses. Exam reveals no gallop and no friction rub.  No murmur heard. Pulmonary/Chest: Effort normal and breath sounds normal. No respiratory distress. She has no wheezes. She has no rales.  Abdominal: Soft. Bowel sounds are normal. She exhibits no distension and no mass. There is no tenderness.  Obese abdomen - non tender diffusely soft  Musculoskeletal: Normal range of motion. She exhibits no edema or tenderness.  Lymphadenopathy:    She has no cervical adenopathy.  Neurological: She is alert. Coordination normal.  Normal speech Normal  coordination Normal gait Normal strength, Normal Level of Alertness  Skin: Skin is warm and dry. No rash noted. No erythema.  Psychiatric: She has a normal mood and affect. Her behavior is normal.  Nursing note and vitals reviewed.    ED Treatments / Results  Labs (all labs ordered are listed, but only abnormal results are displayed) Labs Reviewed  CBC WITH DIFFERENTIAL/PLATELET - Abnormal; Notable for the following components:      Result Value   Hemoglobin 11.5 (*)    All other components within normal limits  BASIC METABOLIC PANEL - Abnormal; Notable for the following components:   Potassium 3.0 (*)    All other components within normal limits    EKG None  Radiology No results found.  Procedures Procedures (including critical care time)  Medications Ordered in ED Medications  cloNIDine (CATAPRES) tablet 0.1 mg (has no administration in time range)     Initial Impression /  Assessment and Plan / ED Course  I have reviewed the triage vital signs and the nursing notes.  Pertinent labs & imaging results that were available during my care of the patient were reviewed by me and considered in my medical decision making (see chart for details).  Clinical Course as of Jul 30 1651  Thu Jul 29, 2018  1601 Lab work is unremarkable with regards to the patient's kidney function, at this point it would probably be beneficial to increase the doses of some of her medications.  I discussed this with the patient and she is in agreement.  Her blood pressure is chronically elevated, none of what we are seeing today seems new and this is been progressive over many years.  Rapid correction of the blood pressure could be detrimental and thus we will avoid aggressive blood pressure control.  She is aware that this needs to be lowered gradually over time and needs to follow-up closely for repeat blood pressure check.   [BM]    Clinical Course User Index [BM] Eber Hong, MD   The  patient's exam is totally unremarkable, her blood pressure is elevated, her heart rate is 80, it is suggestive of underlying pathologic process, would consider renal artery stenosis or other intra-abdominal or vascular phenomena.  She reports that she does not have a family doctor, she goes to the health department as needed, she does not have medical insurance, she will need to have increased doses of medications to help control her blood pressure, may need to use clonidine as an alternative if her blood pressure continues to be this severely elevated despite the use of 3 different medications.  She did not tolerate hydralazine thus it would be unwise to continue that as she would not be compliant with it.  Her exam is unremarkable, neurologically, iliac exam as well as pulmonary exam.    Final Clinical Impressions(s) / ED Diagnoses   Final diagnoses:  Essential hypertension    ED Discharge Orders         Ordered    lisinopril-hydrochlorothiazide (PRINZIDE,ZESTORETIC) 20-25 MG tablet  Daily     07/29/18 1649    metoprolol tartrate (LOPRESSOR) 50 MG tablet  2 times daily     07/29/18 1649    cloNIDine (CATAPRES) 0.1 MG tablet  2 times daily     07/29/18 1649           Eber Hong, MD 07/29/18 1652

## 2018-07-29 NOTE — ED Notes (Signed)
Pt denies any chest pain or other symptoms at this time. EKG completed at health department and sent over with pt.

## 2018-07-29 NOTE — ED Triage Notes (Signed)
Pt was placed on blood pressure meds in august.  Pt took her self off on her hydralazine.  Health dept sent patient to ED to be seen. BP 189/103.

## 2018-07-29 NOTE — Discharge Instructions (Signed)
Your blood pressure is significantly elevated however at this time it can continue to be treated with medications that you are taking.  I have increased the doses, please get the new medications filled as I have prescribed.  The new medications are as follows  Clonidine, 0.1 mg by mouth twice daily Metoprolol 50mg  by mouth twice daily Lisinopril 20 / HCTZ 25 - once daily  You must have a follow-up with your doctor within the next 2 weeks for a blood pressure check.  You must have them look at other causes of blood pressure including your kidneys and the blood flow to your kidneys.  If you should develop increasing severe or worsening symptoms you should return to the emergency department.  This includes headache, chest pain, difficulty breathing, dizziness, weakness or numbness.

## 2018-09-15 ENCOUNTER — Other Ambulatory Visit: Payer: Self-pay

## 2018-09-15 ENCOUNTER — Emergency Department (HOSPITAL_COMMUNITY)
Admission: EM | Admit: 2018-09-15 | Discharge: 2018-09-15 | Disposition: A | Discharge status: Home / Self Care | Payer: Self-pay | Attending: Emergency Medicine | Admitting: Emergency Medicine

## 2018-09-15 ENCOUNTER — Encounter (HOSPITAL_COMMUNITY): Payer: Self-pay | Admitting: Emergency Medicine

## 2018-09-15 DIAGNOSIS — Z760 Encounter for issue of repeat prescription: Secondary | ICD-10-CM | POA: Insufficient documentation

## 2018-09-15 DIAGNOSIS — I10 Essential (primary) hypertension: Secondary | ICD-10-CM | POA: Insufficient documentation

## 2018-09-15 DIAGNOSIS — Z79899 Other long term (current) drug therapy: Secondary | ICD-10-CM | POA: Insufficient documentation

## 2018-09-15 MED ORDER — METOPROLOL TARTRATE 50 MG PO TABS
50.0000 mg | ORAL_TABLET | Freq: Once | ORAL | Status: AC
  Administered 2018-09-15: 50 mg via ORAL
  Filled 2018-09-15: qty 1

## 2018-09-15 MED ORDER — CLONIDINE HCL 0.1 MG PO TABS: 0.1000 mg | ORAL_TABLET | Freq: Two times a day (BID) | ORAL | 1 refills | Status: DC

## 2018-09-15 MED ORDER — HYDROCHLOROTHIAZIDE 25 MG PO TABS
25.0000 mg | ORAL_TABLET | Freq: Every day | ORAL | Status: DC
  Administered 2018-09-15: 25 mg via ORAL
  Filled 2018-09-15: qty 1

## 2018-09-15 MED ORDER — LISINOPRIL-HYDROCHLOROTHIAZIDE 20-25 MG PO TABS: 1.0000 | ORAL_TABLET | Freq: Every day | ORAL | 1 refills | Status: DC

## 2018-09-15 MED ORDER — CLONIDINE HCL 0.1 MG PO TABS
0.1000 mg | ORAL_TABLET | Freq: Once | ORAL | Status: AC
  Administered 2018-09-15: 0.1 mg via ORAL
  Filled 2018-09-15: qty 1

## 2018-09-15 MED ORDER — LISINOPRIL-HYDROCHLOROTHIAZIDE 20-25 MG PO TABS: 1.0000 | ORAL_TABLET | Freq: Every day | ORAL | 1 refills | Status: AC

## 2018-09-15 MED ORDER — CLONIDINE HCL 0.1 MG PO TABS: 0.1000 mg | ORAL_TABLET | Freq: Two times a day (BID) | ORAL | 1 refills | Status: AC

## 2018-09-15 MED ORDER — METOPROLOL TARTRATE 50 MG PO TABS: 50.0000 mg | ORAL_TABLET | Freq: Two times a day (BID) | ORAL | 1 refills | Status: DC

## 2018-09-15 MED ORDER — LISINOPRIL 10 MG PO TABS
20.0000 mg | ORAL_TABLET | Freq: Every day | ORAL | Status: DC
  Administered 2018-09-15: 20 mg via ORAL
  Filled 2018-09-15: qty 2

## 2018-09-15 MED ORDER — METOPROLOL TARTRATE 50 MG PO TABS: 50.0000 mg | ORAL_TABLET | Freq: Two times a day (BID) | ORAL | 1 refills | Status: AC

## 2018-09-15 MED ORDER — LISINOPRIL-HYDROCHLOROTHIAZIDE 20-25 MG PO TABS: 1.0000 | ORAL_TABLET | Freq: Every day | ORAL | Status: DC

## 2018-09-15 NOTE — ED Triage Notes (Signed)
Patient states she ran out of her BP med and hasn't taken it in the past 2 days.

## 2018-09-15 NOTE — ED Triage Notes (Signed)
Patient sent by MD for hypertension, does not remember the BP reading.

## 2018-09-15 NOTE — ED Provider Notes (Signed)
[facility] EMERGENCY DEPARTMENT Provider Note   CSN: [id number] Arrival date & time: 09/15/18  1549     History   Chief Complaint Chief Complaint  Patient presents with  . Hypertension    HPI Leah Palmer is a 27 y.o. female.  Pt presents to the ED today with elevated BP.  Pt has a hx of htn and is supposed to be on clonidine, lopressor, and lisinopril.  She has been out of her meds for 2 days.  She does not have a pcp.  The pt has a headache which she gets when her bp is elevated.  No other associated sx.  She said she feels fine.     Past Medical History:  Diagnosis Date  . Biliary colic   . Hypertension   . Noncompliance with medication regimen     Patient Active Problem List   Diagnosis Date Noted  . Noncompliance with medication regimen 06/30/2018  . Acute cholecystitis 06/27/2018  . Hypertension   . Biliary colic     Past Surgical History:  Procedure Laterality Date  . CHOLECYSTECTOMY N/A 06/28/2018   Procedure: LAPAROSCOPIC CHOLECYSTECTOMY;  Surgeon: Lucretia Roers, MD;  Location: [facility];  Service: General;  Laterality: N/A;  . INGUINAL HERNIA REPAIR     around 27 years of age  . NO PAST SURGERIES       OB History    Gravida  0   Para  0   Term  0   Preterm  0   AB  0   Living  0     SAB  0   TAB  0   Ectopic  0   Multiple  0   Live Births  0            Home Medications    Prior to Admission medications   Medication Sig Start Date End Date Taking? Authorizing Provider  cloNIDine (CATAPRES) 0.1 MG tablet Take 1 tablet (0.1 mg total) by mouth 2 (two) times daily. 09/15/18 10/15/18  Jacalyn Lefevre, MD  lisinopril-hydrochlorothiazide (PRINZIDE,ZESTORETIC) 20-25 MG tablet Take 1 tablet by mouth daily. 09/15/18   Jacalyn Lefevre, MD  metoprolol tartrate (LOPRESSOR) 50 MG tablet Take 1 tablet (50 mg total) by mouth 2 (two) times daily. 09/15/18   Jacalyn Lefevre, MD  potassium chloride (K-DUR) 10 MEQ tablet Take 1 tablet (10 mEq  total) by mouth daily. 06/30/18 07/30/18  Cleora Fleet, MD    Family History Family History  Problem Relation Age of Onset  . Gallbladder disease Mother   . Hypertension Mother   . Diabetes Father     Social History Social History   Tobacco Use  . Smoking status: Never Smoker  . Smokeless tobacco: Never Used  Substance Use Topics  . Alcohol use: Yes    Frequency: Never  . Drug use: Never     Allergies   Patient has no known allergies.   Review of Systems Review of Systems  Neurological: Positive for headaches.     Physical Exam Updated Vital Signs BP (!) 199/114 (BP Location: Right Wrist) Comment: pt ran out of bp meds  Pulse 95   Temp 97.9 F (36.6 C) (Oral)   Resp 18   LMP 07/16/2018   SpO2 99%   Physical Exam  Constitutional: She is oriented to person, place, and time. She appears well-developed and well-nourished.  HENT:  Head: Normocephalic and atraumatic.  Right Ear: External ear normal.  Left Ear: External ear normal.  Nose: Nose normal.  Mouth/Throat: Oropharynx is clear and moist.  Eyes: Pupils are equal, round, and reactive to light. Conjunctivae and EOM are normal.  Neck: Normal range of motion. Neck supple.  Cardiovascular: Normal rate, regular rhythm, normal heart sounds and intact distal pulses.  Pulmonary/Chest: Effort normal and breath sounds normal.  Abdominal: Soft. Bowel sounds are normal.  Musculoskeletal: Normal range of motion.  Neurological: She is alert and oriented to person, place, and time.  Skin: Skin is warm. Capillary refill takes less than 2 seconds.  Psychiatric: She has a normal mood and affect. Her behavior is normal. Judgment and thought content normal.  Nursing note and vitals reviewed.    ED Treatments / Results  Labs (all labs ordered are listed, but only abnormal results are displayed) Labs Reviewed - No data to display  EKG None  Radiology No results found.  Procedures Procedures (including  critical care time)  Medications Ordered in ED Medications  cloNIDine (CATAPRES) tablet 0.1 mg (has no administration in time range)  metoprolol tartrate (LOPRESSOR) tablet 50 mg (has no administration in time range)  lisinopril (PRINIVIL,ZESTRIL) tablet 20 mg (has no administration in time range)  hydrochlorothiazide (HYDRODIURIL) tablet 25 mg (has no administration in time range)     Initial Impression / Assessment and Plan / ED Course  I have reviewed the triage vital signs and the nursing notes.  Pertinent labs & imaging results that were available during my care of the patient were reviewed by me and considered in my medical decision making (see chart for details).    Pt has a hx of htn.  Neurologically normal.  Pt is d/c home with a rx of her meds with her first dose given here.  The pt was here on 9/5 for the same and had labs done then.  Kidney function good.  Pt encouraged to establish primary care.  Return if worse.  Final Clinical Impressions(s) / ED Diagnoses   Final diagnoses:  Essential hypertension    ED Discharge Orders         Ordered    cloNIDine (CATAPRES) 0.1 MG tablet  2 times daily,   Status:  Discontinued     09/15/18 1701    lisinopril-hydrochlorothiazide (PRINZIDE,ZESTORETIC) 20-25 MG tablet  Daily,   Status:  Discontinued     09/15/18 1701    metoprolol tartrate (LOPRESSOR) 50 MG tablet  2 times daily,   Status:  Discontinued     09/15/18 1701    lisinopril-hydrochlorothiazide (PRINZIDE,ZESTORETIC) 20-25 MG tablet  Daily     09/15/18 1702    cloNIDine (CATAPRES) 0.1 MG tablet  2 times daily     09/15/18 1702    metoprolol tartrate (LOPRESSOR) 50 MG tablet  2 times daily     09/15/18 1702           Jacalyn Lefevre, MD 09/15/18 1706

## 2019-10-18 IMAGING — US US ABDOMEN LIMITED
1 series · 14 of 25 positions shown · non-contrast
Comparison: None.

CLINICAL DATA: Right upper quadrant pain since 5 a.m.

EXAM:
ULTRASOUND ABDOMEN LIMITED RIGHT UPPER QUADRANT

[Series 1: us abdomen limited · 0.24mm/px · 14 of 115 slices shown]
[im 1/115]
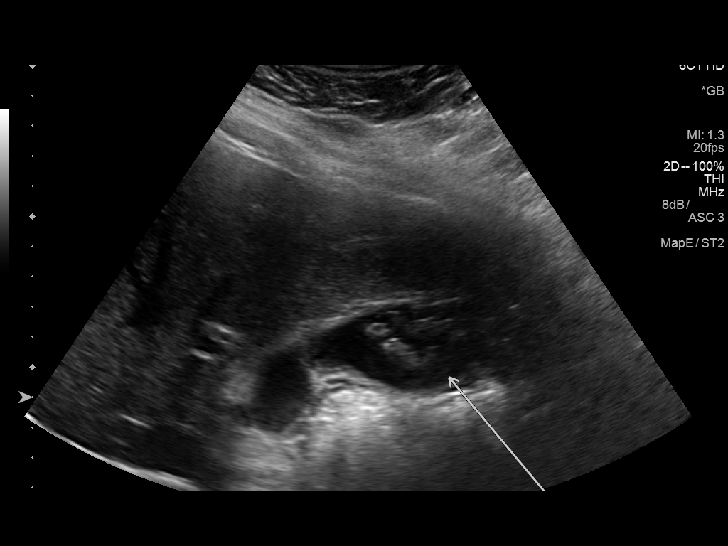
[im 10/115]
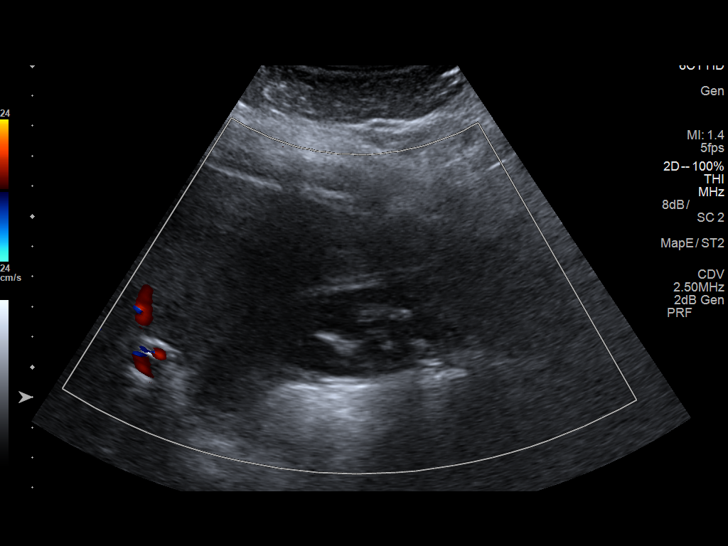
[im 20/115]
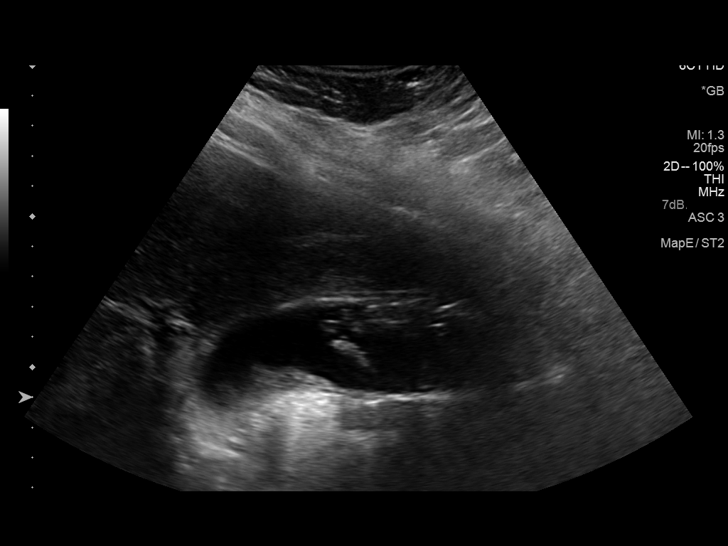
[im 29/115]
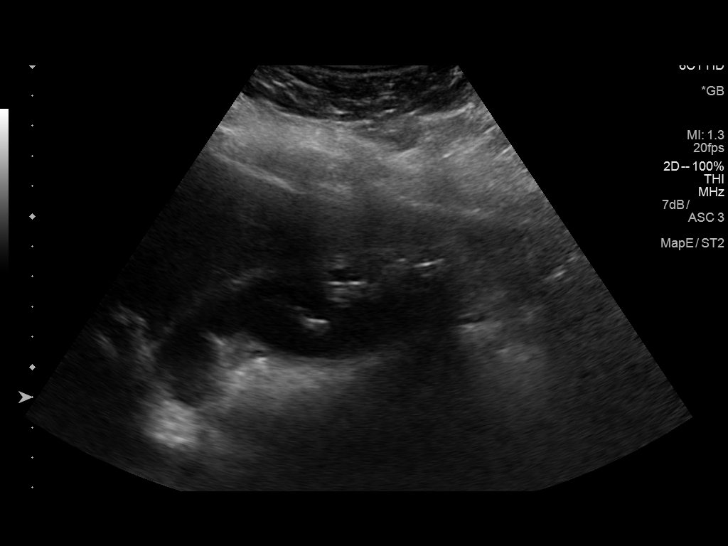
[im 39/115]
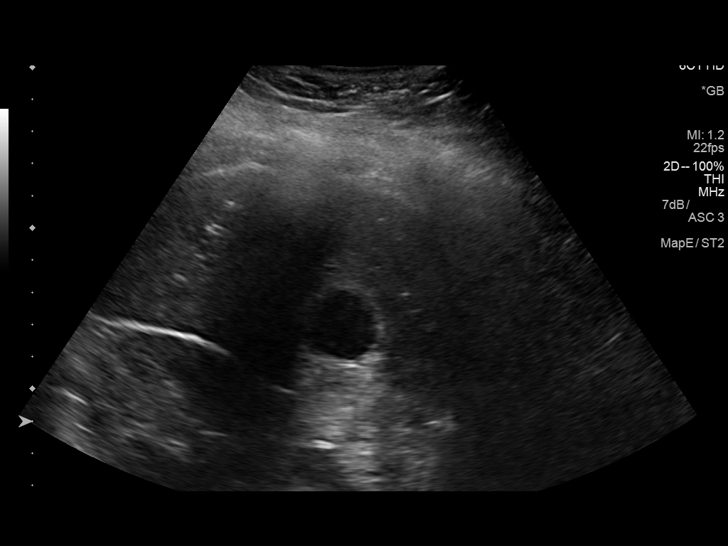
[im 43/115]
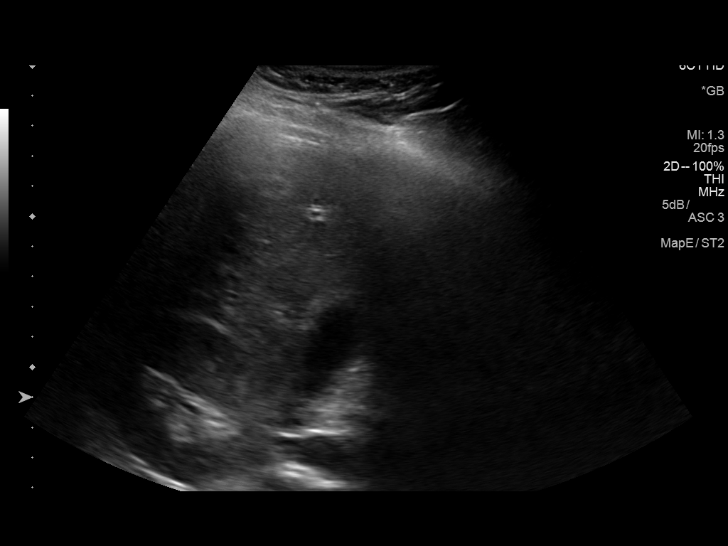
[im 53/115]
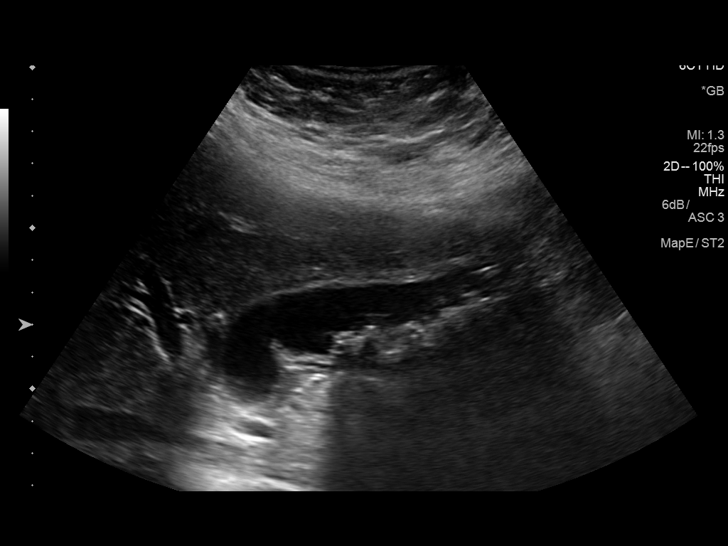
[im 62/115]
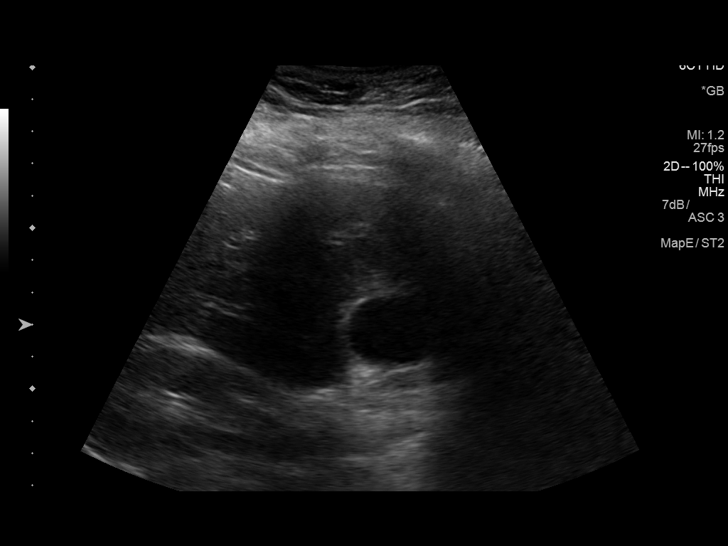
[im 72/115]
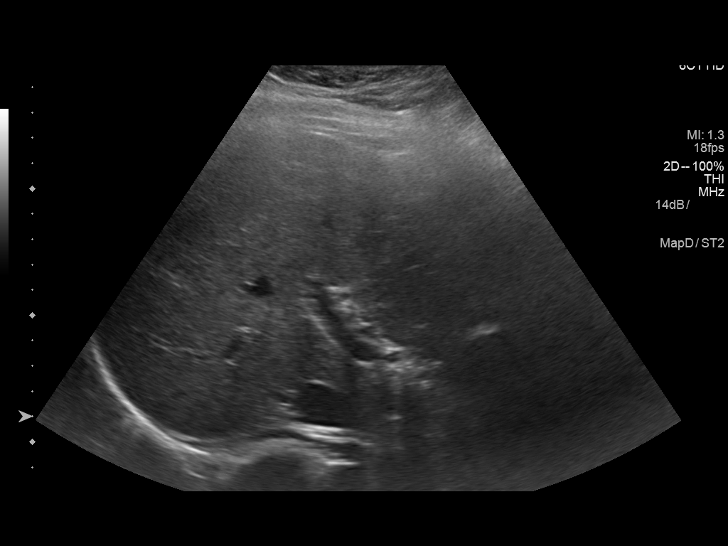
[im 77/115]
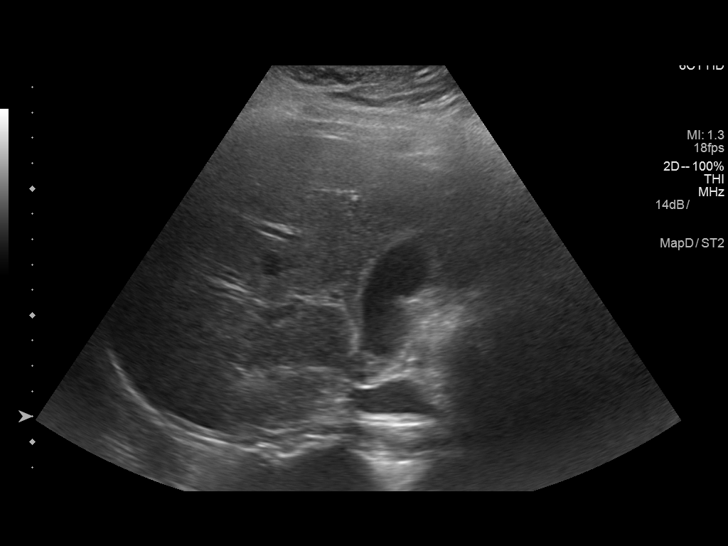
[im 86/115]
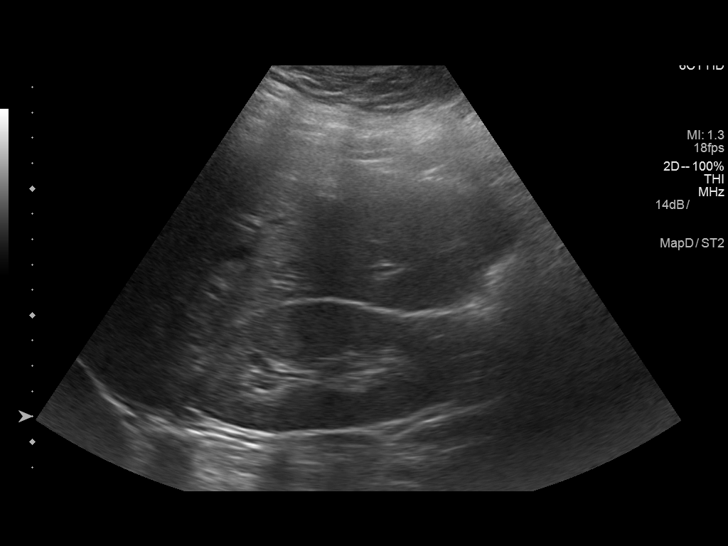
[im 96/115]
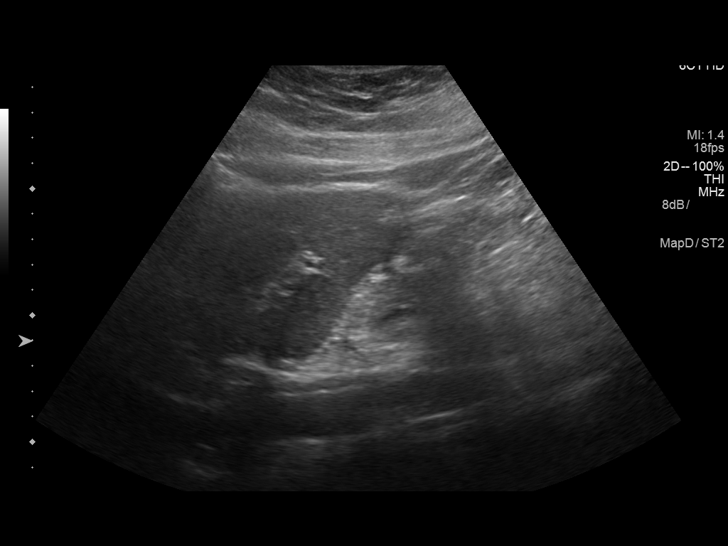
[im 105/115]
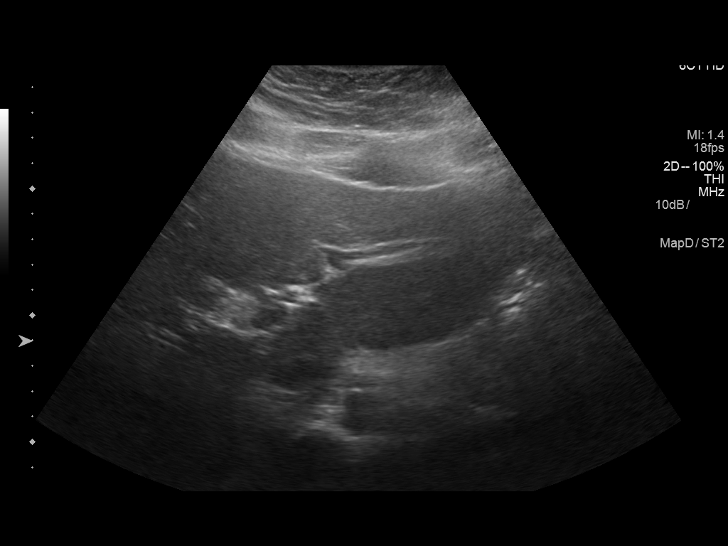
[im 115/115]
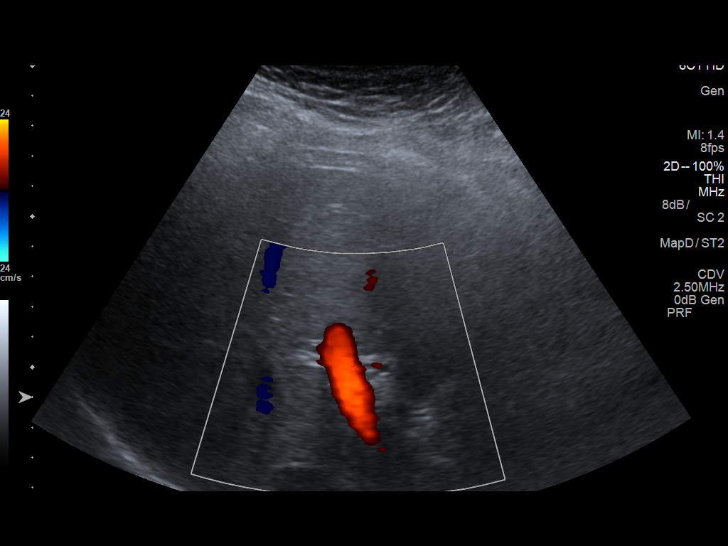

[14 of 25 positions shown; findings below may reference images not displayed]

FINDINGS: Gallbladder:

Multiple shadowing and layering gallstones. There is mild
gallbladder wall thickening to 4-5 mm, but no focal tenderness or
pericholecystic edema.

Common bile duct:

Diameter: 5 mm. Incomplete coverage. Where visualized, no filling
defect.

Liver:

No focal lesion identified. Within normal limits in parenchymal
echogenicity. Portal vein is patent on color Doppler imaging with
normal direction of blood flow towards the liver.
IMPRESSION: Multiple gallstones. There is gallbladder wall thickening but no
focal tenderness or pericholecystic edema to increase chances of
acute cholecystitis.

## 2019-12-29 IMAGING — CT CT ABD-PELV W/ CM
2 of 4 series · 16 of 46 positions shown, 18 images · IV contrast (iopamidol)
Comparison: Pelvic ultrasound June 19, 2018 and abdominal
ultrasound June 19, 2018.

CLINICAL DATA: Upper abdominal pain beginning this morning, nausea
and diarrhea. History of biliary colic and hypertension.

EXAM:
CT ABDOMEN AND PELVIS WITH CONTRAST
TECHNIQUE: Multidetector CT imaging of the abdomen and pelvis was performed
using the standard protocol following bolus administration of
intravenous contrast.
CONTRAST:  100mL B5MCKL-6ZZ IOPAMIDOL (B5MCKL-6ZZ) INJECTION 61%

[Series 2: axial st · axial · 0.75mm/px · z∈[-415,+25]mm · 13 of 98 slices shown, 15 images]
[im 5/98  soft-tissue]
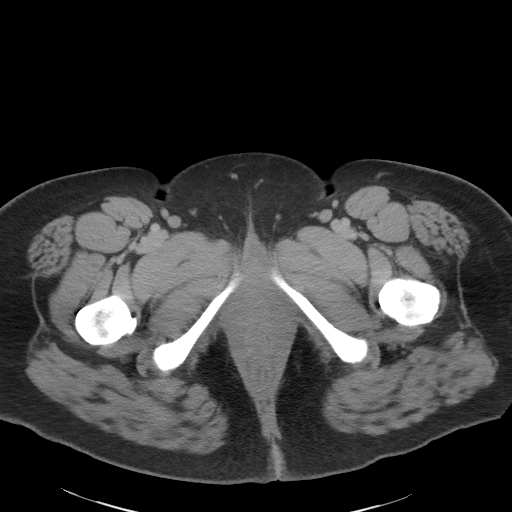
[im 5/98  bone]
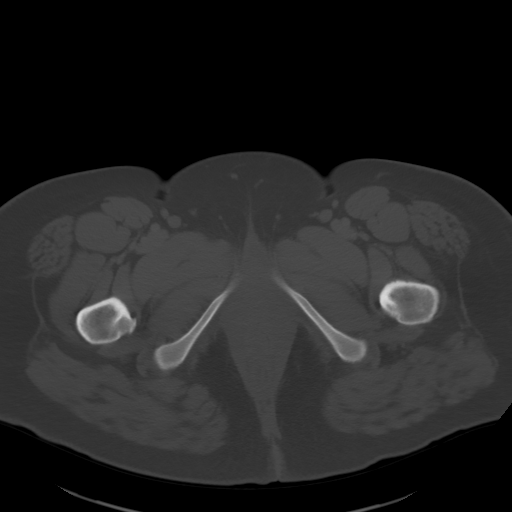
[im 13/98  soft-tissue]
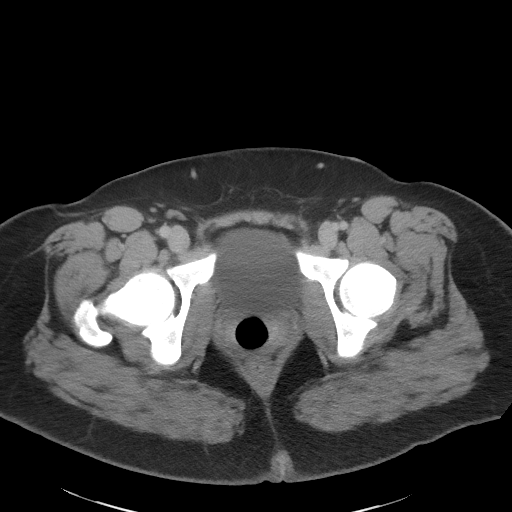
[im 22/98  soft-tissue]
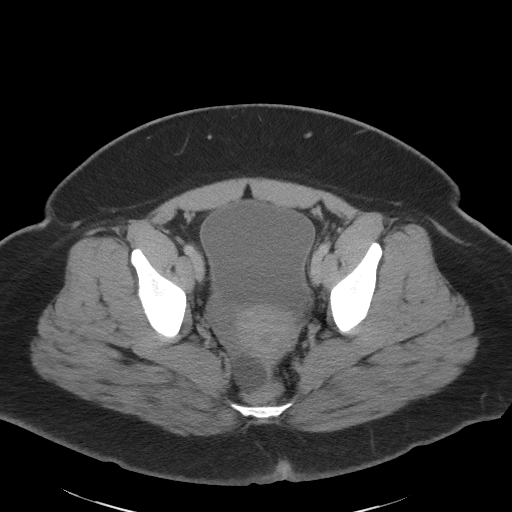
[im 26/98  soft-tissue]
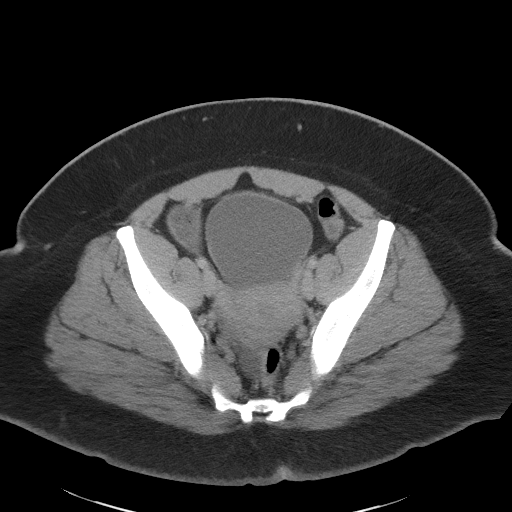
[im 34/98  soft-tissue]
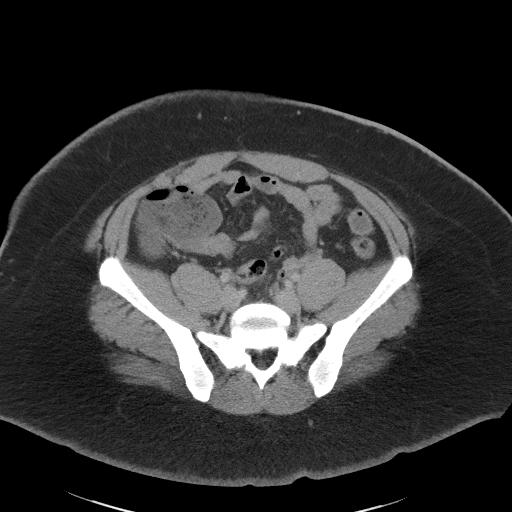
[im 43/98  soft-tissue]
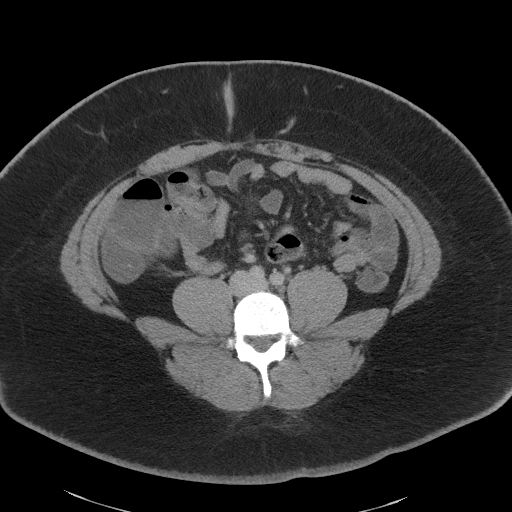
[im 51/98  soft-tissue]
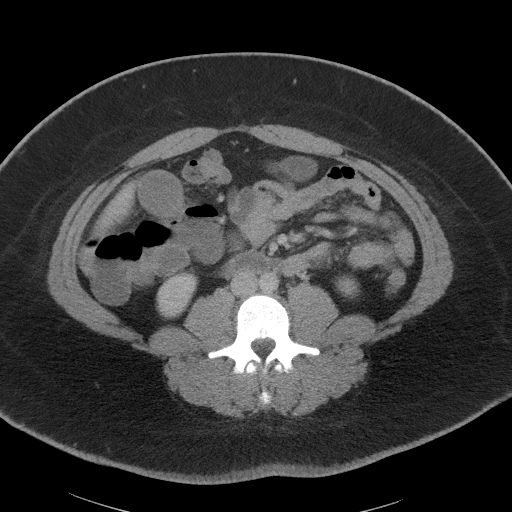
[im 55/98  soft-tissue]
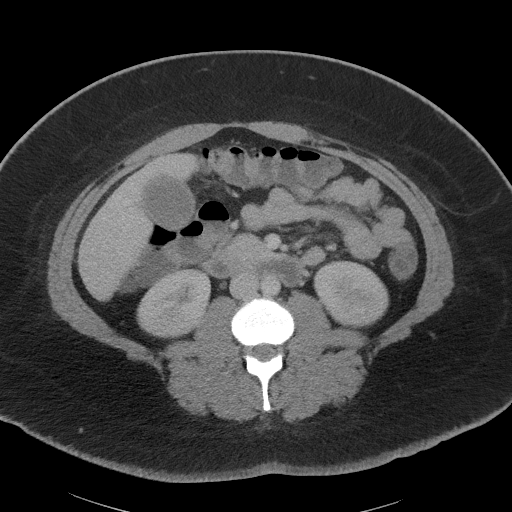
[im 64/98  soft-tissue]
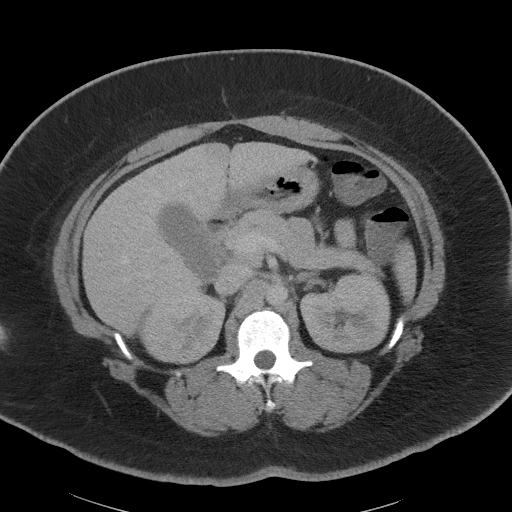
[im 64/98  bone]
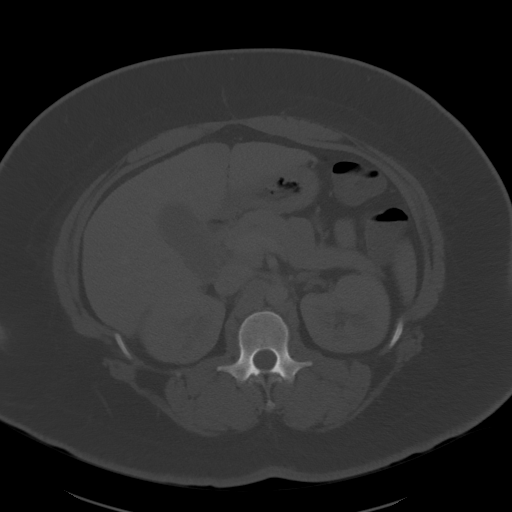
[im 72/98  soft-tissue]
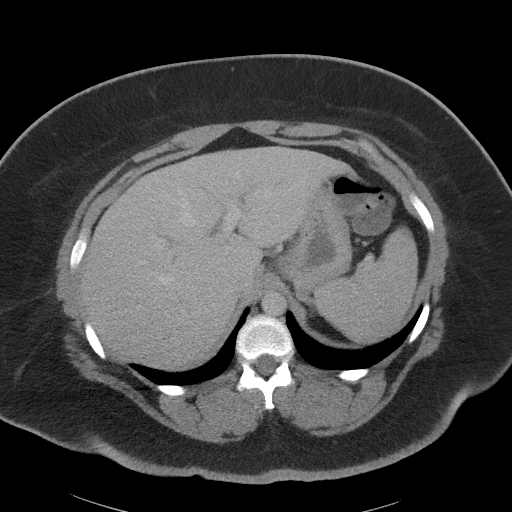
[im 76/98  soft-tissue]
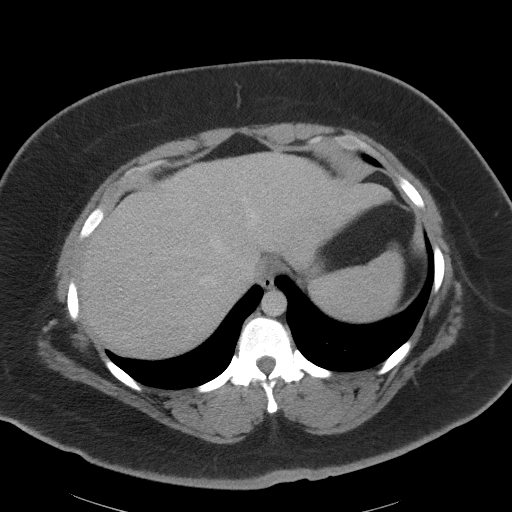
[im 85/98  soft-tissue]
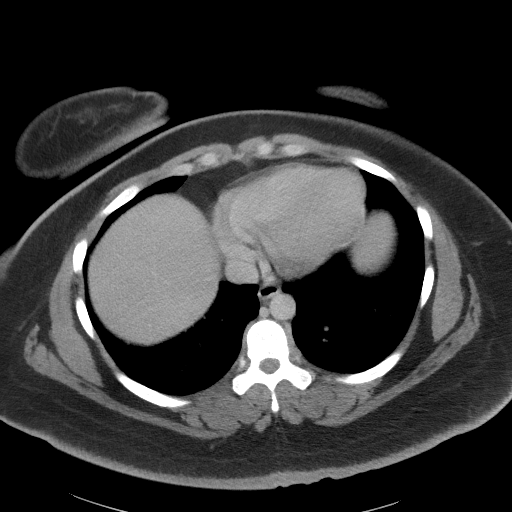
[im 93/98  soft-tissue]
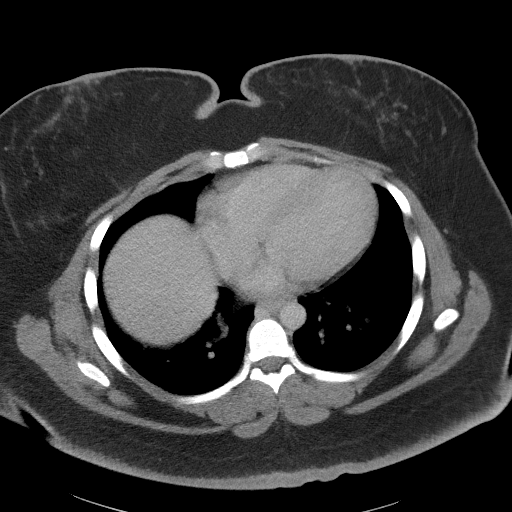

[Series 5: coronal st · coronal · 0.85mm/px · 3 of 100 slices shown]
[im 34/100  soft-tissue]
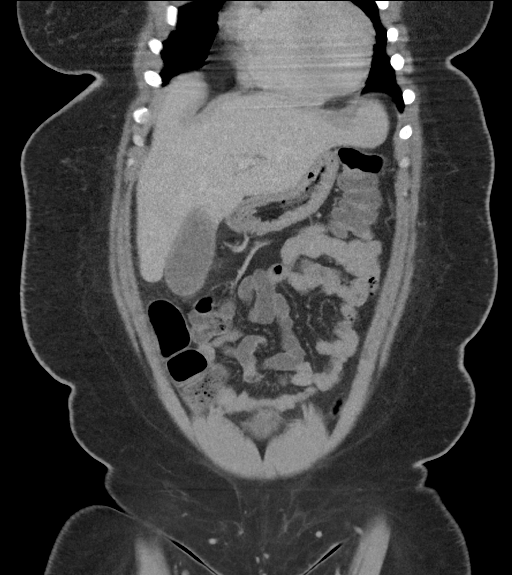
[im 45/100  soft-tissue]
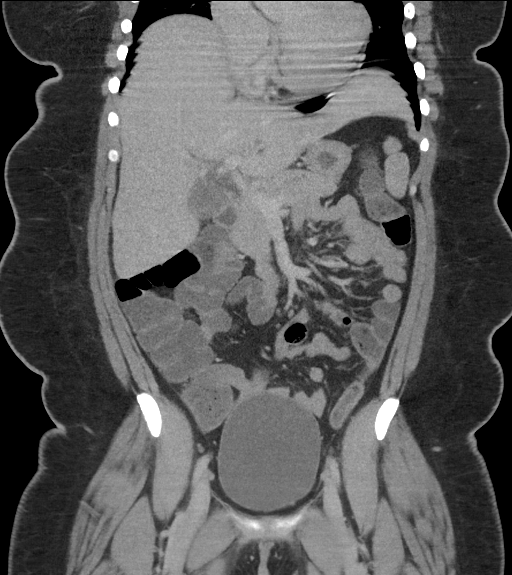
[im 56/100  soft-tissue]
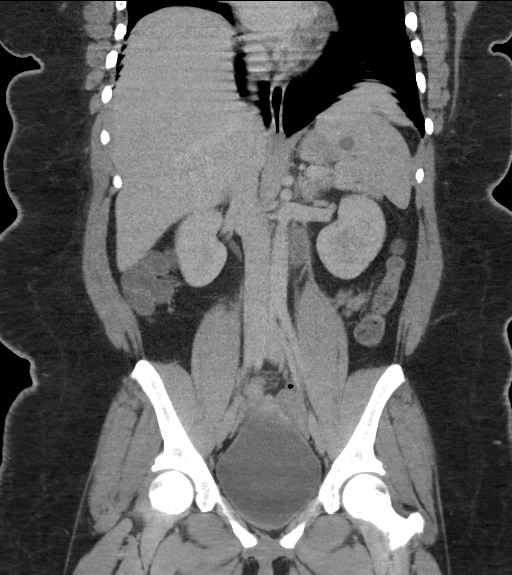

[16 of 46 positions shown; findings below may reference images not displayed]

FINDINGS: LOWER CHEST: Lung bases are clear. Included heart size is normal. No
pericardial effusion.

HEPATOBILIARY: Mild gallbladder distention and pericholecystic
fluid. No CT findings of cholelithiasis though ultrasound is more
sensitive. Minimal intrahepatic biliary dilatation. Focal fatty
infiltration about the falciform ligament.

PANCREAS: Normal.

SPLEEN: 14 mm homogeneously hypodense cyst or lymphangioma in the
spleen.

ADRENALS/URINARY TRACT: Kidneys are orthotopic, demonstrating
symmetric enhancement. No nephrolithiasis, hydronephrosis or solid
renal masses. The unopacified ureters are normal in course and
caliber. Urinary bladder is adequately distended and unremarkable.
Normal adrenal glands.

STOMACH/BOWEL: The stomach, small and large bowel are normal in
course and caliber without inflammatory changes. Small and large
bowel air-fluid levels. Fluid distended 10 mm appendix without wall
thickening or periappendiceal inflammation.

VASCULAR/LYMPHATIC: Aortoiliac vessels are normal in course and
caliber. No lymphadenopathy by CT size criteria.

REPRODUCTIVE: 3 cm homogeneously hypodense benign-appearing RIGHT
adnexal cyst. Air density in the vagina consistent with a tampon.

OTHER: No intraperitoneal free fluid or free air.

MUSCULOSKELETAL: Nonacute.
IMPRESSION: 1. Pericholecystic fluid and mild intrahepatic biliary dilatation
concerning for acute cholecystitis.
2. Small and large bowel air-fluid levels seen with enteritis.
3. Enlarged 10 mm appendix without corroborative findings of acute
appendicitis. This may be related to patient's enteritis.
